# Patient Record
Sex: Female | Born: 1995 | Race: White | Hispanic: No | Marital: Single | Smoking: Never smoker
Health system: Southern US, Community
[De-identification: ages and names within clinical notes are randomized; demographics above are authoritative.]

## PROBLEM LIST (undated history)

## (undated) DIAGNOSIS — M722 Plantar fascial fibromatosis: Secondary | ICD-10-CM

## (undated) DIAGNOSIS — H8113 Benign paroxysmal vertigo, bilateral: Secondary | ICD-10-CM

## (undated) DIAGNOSIS — Z9889 Other specified postprocedural states: Secondary | ICD-10-CM

## (undated) DIAGNOSIS — R112 Nausea with vomiting, unspecified: Secondary | ICD-10-CM

## (undated) DIAGNOSIS — L309 Dermatitis, unspecified: Secondary | ICD-10-CM

## (undated) DIAGNOSIS — Z8709 Personal history of other diseases of the respiratory system: Secondary | ICD-10-CM

## (undated) DIAGNOSIS — M359 Systemic involvement of connective tissue, unspecified: Secondary | ICD-10-CM

## (undated) HISTORY — DX: Plantar fascial fibromatosis: M72.2

## (undated) HISTORY — PX: OTHER SURGICAL HISTORY: SHX169

## (undated) HISTORY — PX: FOOT SURGERY: SHX648

## (undated) HISTORY — DX: Dermatitis, unspecified: L30.9

## (undated) HISTORY — PX: IRRIGATION AND DEBRIDEMENT SEBACEOUS CYST: SHX5255

## (undated) HISTORY — PX: WISDOM TOOTH EXTRACTION: SHX21

## (undated) HISTORY — PX: SINOSCOPY: SHX187

## (undated) HISTORY — DX: Personal history of other diseases of the respiratory system: Z87.09

## (undated) HISTORY — PX: ELBOW SURGERY: SHX618

---

## 2016-01-18 NOTE — Telephone Encounter (Signed)
S: Pt. Calling about a worsened sore throat since stopping her nystatin rx.  B: Pt. Recently diagnosed with mono, has also been taking nystatin for thrush. At her visit on 12/21 she was instructed to stop the nystatin, and that it appeared the infection had resolved.  A: Pt. States since stopping the nystatin the sore throat has returned, and she is unsure if it is due to the thrush or the mono. She does see several white raised areas on the back of her tongue towards her throat.  R: RN paged on call physician Dr. Iran PlanasSoto.   Dr. Iran PlanasSoto advised ok to start Nystatin back up again (pt. Has one refill available). She advised if pt. Not feeling better by Tuesday to call office back for further instructions or possible re-evaluation.  Reason for Disposition  ??? [1] Pus on tonsils (back of throat) AND [2]  fever AND [3] swollen neck lymph nodes ("glands")    Protocols used: SORE THROAT-ADULT-AH

## 2017-02-15 ENCOUNTER — Ambulatory Visit: Payer: BC Managed Care – PPO | Admitting: Obstetrics & Gynecology

## 2017-02-15 ENCOUNTER — Encounter: Payer: Self-pay | Admitting: Obstetrics & Gynecology

## 2017-02-15 ENCOUNTER — Ambulatory Visit (INDEPENDENT_AMBULATORY_CARE_PROVIDER_SITE_OTHER): Payer: BC Managed Care – PPO

## 2017-02-15 VITALS — BP 114/76 | Ht 62.0 in | Wt 118.0 lb

## 2017-02-15 DIAGNOSIS — T8332XA Displacement of intrauterine contraceptive device, initial encounter: Secondary | ICD-10-CM

## 2017-02-15 DIAGNOSIS — Z30431 Encounter for routine checking of intrauterine contraceptive device: Secondary | ICD-10-CM

## 2017-02-15 DIAGNOSIS — Z01419 Encounter for gynecological examination (general) (routine) without abnormal findings: Secondary | ICD-10-CM

## 2017-02-15 DIAGNOSIS — Z113 Encounter for screening for infections with a predominantly sexual mode of transmission: Secondary | ICD-10-CM | POA: Diagnosis not present

## 2017-02-15 DIAGNOSIS — N898 Other specified noninflammatory disorders of vagina: Secondary | ICD-10-CM | POA: Diagnosis not present

## 2017-02-15 LAB — WET PREP FOR TRICH, YEAST, CLUE

## 2017-02-15 MED ORDER — FLUCONAZOLE 150 MG PO TABS: 150.0000 mg | ORAL_TABLET | Freq: Every day | ORAL | 1 refills | Status: AC

## 2017-02-15 MED ORDER — TINIDAZOLE 500 MG PO TABS: 2.0000 g | ORAL_TABLET | Freq: Every day | ORAL | 0 refills | Status: AC

## 2017-02-15 NOTE — Patient Instructions (Signed)
1. Encounter for routine gynecological examination with Papanicolaou smear of cervix Normal gynecologic exam and normal pelvic ultrasound.  Pap reflex done today.  Breast exam normal.  Will establish with family physician.  2. Encounter for routine checking of intrauterine contraceptive device (IUD) Well on Mirena IUD since November 2017.  Strings not visible on exam.  3. Intrauterine contraceptive device threads lost, initial encounter Confirmed good intra-uterine position of the Mirena IUD by ultrasound today.  Patient reassured. - US Transvaginal Non-OB  4. Routine screening for STI (sexually transmitted infection) Gonorrhea and Chlamydia done on the Pap test.  Declines rest of STI screen.  5. Vaginal itching Wet prep revealed both a bacterial vaginosis and a yeast infection.  Will treat the bacterial vaginosis first with tinidazole tablets.  Usage reviewed.  Will then start fluconazole to treat the yeast vaginitis.  Can use probiotic afterwards to prevent recurrence. - WET PREP FOR [place], YEAST, CLUE  Other orders - tinidazole (TINDAMAX) 500 MG tablet; Take 4 tablets (2,000 mg total) by mouth daily for 2 days. - fluconazole (DIFLUCAN) 150 MG tablet; Take 1 tablet (150 mg total) by mouth daily for 3 days.  Roberta Gillespie, it was a pleasure meeting you today!  I will inform you of your results as soon as they are available.  Health Maintenance, Female Adopting a healthy lifestyle and getting preventive care can go a long way to promote health and wellness. Talk with your health care provider about what schedule of regular examinations is right for you. This is a good chance for you to check in with your provider about disease prevention and staying healthy. In between checkups, there are plenty of things you can do on your own. Experts have done a lot of research about which lifestyle changes and preventive measures are most likely to keep you healthy. Ask your health care provider for more  information. Weight and diet Eat a healthy diet  Be sure to include plenty of vegetables, fruits, low-fat dairy products, and lean protein.  Do not eat a lot of foods high in solid fats, added sugars, or salt.  Get regular exercise. This is one of the most important things you can do for your health. ? Most adults should exercise for at least 150 minutes each week. The exercise should increase your heart rate and make you sweat (moderate-intensity exercise). ? Most adults should also do strengthening exercises at least twice a week. This is in addition to the moderate-intensity exercise.  Maintain a healthy weight  Body mass index (BMI) is a measurement that can be used to identify possible weight problems. It estimates body fat based on height and weight. Your health care provider can help determine your BMI and help you achieve or maintain a healthy weight.  For females 8 years of age and older: ? A BMI below 18.5 is considered underweight. ? A BMI of 18.5 to 24.9 is normal. ? A BMI of 25 to 29.9 is considered overweight. ? A BMI of 30 and above is considered obese.  Watch levels of cholesterol and blood lipids  You should start having your blood tested for lipids and cholesterol at 24 years of age, then have this test every 5 years.  You may need to have your cholesterol levels checked more often if: ? Your lipid or cholesterol levels are high. ? You are older than 24 years of age. ? You are at high risk for heart disease.  Cancer screening Lung Cancer  Lung cancer screening  is recommended for adults 76-6 years old who are at high risk for lung cancer because of a history of smoking.  A yearly low-dose CT scan of the lungs is recommended for people who: ? Currently smoke. ? Have quit within the past 15 years. ? Have at least a 30-pack-year history of smoking. A pack year is smoking an average of one pack of cigarettes a day for 1 year.  Yearly screening should continue  until it has been 15 years since you quit.  Yearly screening should stop if you develop a health problem that would prevent you from having lung cancer treatment.  Breast Cancer  Practice breast self-awareness. This means understanding how your breasts normally appear and feel.  It also means doing regular breast self-exams. Let your health care provider know about any changes, no matter how small.  If you are in your 20s or 30s, you should have a clinical breast exam (CBE) by a health care provider every 1-3 years as part of a regular health exam.  If you are 90 or older, have a CBE every year. Also consider having a breast X-ray (mammogram) every year.  If you have a family history of breast cancer, talk to your health care provider about genetic screening.  If you are at high risk for breast cancer, talk to your health care provider about having an MRI and a mammogram every year.  Breast cancer gene (BRCA) assessment is recommended for women who have family members with BRCA-related cancers. BRCA-related cancers include: ? Breast. ? Ovarian. ? Tubal. ? Peritoneal cancers.  Results of the assessment will determine the need for genetic counseling and BRCA1 and BRCA2 testing.  Cervical Cancer Your health care provider may recommend that you be screened regularly for cancer of the pelvic organs (ovaries, uterus, and vagina). This screening involves a pelvic examination, including checking for microscopic changes to the surface of your cervix (Pap test). You may be encouraged to have this screening done every 3 years, beginning at age 71.  For women ages 44-65, health care providers may recommend pelvic exams and Pap testing every 3 years, or they may recommend the Pap and pelvic exam, combined with testing for human papilloma virus (HPV), every 5 years. Some types of HPV increase your risk of cervical cancer. Testing for HPV may also be done on women of any age with unclear Pap test  results.  Other health care providers may not recommend any screening for nonpregnant women who are considered low risk for pelvic cancer and who do not have symptoms. Ask your health care provider if a screening pelvic exam is right for you.  If you have had past treatment for cervical cancer or a condition that could lead to cancer, you need Pap tests and screening for cancer for at least 20 years after your treatment. If Pap tests have been discontinued, your risk factors (such as having a new sexual partner) need to be reassessed to determine if screening should resume. Some women have medical problems that increase the chance of getting cervical cancer. In these cases, your health care provider may recommend more frequent screening and Pap tests.  Colorectal Cancer  This type of cancer can be detected and often prevented.  Routine colorectal cancer screening usually begins at 24 years of age and continues through 24 years of age.  Your health care provider may recommend screening at an earlier age if you have risk factors for colon cancer.  Your health care provider  may also recommend using home test kits to check for hidden blood in the stool.  A small camera at the end of a tube can be used to examine your colon directly (sigmoidoscopy or colonoscopy). This is done to check for the earliest forms of colorectal cancer.  Routine screening usually begins at age 32.  Direct examination of the colon should be repeated every 5-10 years through 24 years of age. However, you may need to be screened more often if early forms of precancerous polyps or small growths are found.  Skin Cancer  Check your skin from head to toe regularly.  Tell your health care provider about any new moles or changes in moles, especially if there is a change in a mole's shape or color.  Also tell your health care provider if you have a mole that is larger than the size of a pencil eraser.  Always use sunscreen.  Apply sunscreen liberally and repeatedly throughout the day.  Protect yourself by wearing long sleeves, pants, a wide-brimmed hat, and sunglasses whenever you are outside.  Heart disease, diabetes, and high blood pressure  High blood pressure causes heart disease and increases the risk of stroke. High blood pressure is more likely to develop in: ? People who have blood pressure in the high end of the normal range (130-139/85-89 mm Hg). ? People who are overweight or obese. ? People who are African American.  If you are 69-58 years of age, have your blood pressure checked every 3-5 years. If you are 52 years of age or older, have your blood pressure checked every year. You should have your blood pressure measured twice-once when you are at a hospital or clinic, and once when you are not at a hospital or clinic. Record the average of the two measurements. To check your blood pressure when you are not at a hospital or clinic, you can use: ? An automated blood pressure machine at a pharmacy. ? A home blood pressure monitor.  If you are between 73 years and 16 years old, ask your health care provider if you should take aspirin to prevent strokes.  Have regular diabetes screenings. This involves taking a blood sample to check your fasting blood sugar level. ? If you are at a normal weight and have a low risk for diabetes, have this test once every three years after 24 years of age. ? If you are overweight and have a high risk for diabetes, consider being tested at a younger age or more often. Preventing infection Hepatitis B  If you have a higher risk for hepatitis B, you should be screened for this virus. You are considered at high risk for hepatitis B if: ? You were born in a country where hepatitis B is common. Ask your health care provider which countries are considered high risk. ? Your parents were born in a high-risk country, and you have not been immunized against hepatitis B (hepatitis B  vaccine). ? You have HIV or AIDS. ? You use needles to inject street drugs. ? You live with someone who has hepatitis B. ? You have had sex with someone who has hepatitis B. ? You get hemodialysis treatment. ? You take certain medicines for conditions, including cancer, organ transplantation, and autoimmune conditions.  Hepatitis C  Blood testing is recommended for: ? Everyone born from [birth year] through 1967. ? Anyone with known risk factors for hepatitis C.  Sexually transmitted infections (STIs)  You should be screened for sexually transmitted infections (  STIs) including gonorrhea and chlamydia if: ? You are sexually active and are younger than 24 years of age. ? You are older than 24 years of age and your health care provider tells you that you are at risk for this type of infection. ? Your sexual activity has changed since you were last screened and you are at an increased risk for chlamydia or gonorrhea. Ask your health care provider if you are at risk.  If you do not have HIV, but are at risk, it may be recommended that you take a prescription medicine daily to prevent HIV infection. This is called pre-exposure prophylaxis (PrEP). You are considered at risk if: ? You are sexually active and do not regularly use condoms or know the HIV status of your partner(s). ? You take drugs by injection. ? You are sexually active with a partner who has HIV.  Talk with your health care provider about whether you are at high risk of being infected with HIV. If you choose to begin PrEP, you should first be tested for HIV. You should then be tested every 3 months for as long as you are taking PrEP. Pregnancy  If you are premenopausal and you may become pregnant, ask your health care provider about preconception counseling.  If you may become pregnant, take 400 to 800 micrograms (mcg) of folic acid every day.  If you want to prevent pregnancy, talk to your health care provider about birth control  (contraception). Osteoporosis and menopause  Osteoporosis is a disease in which the bones lose minerals and strength with aging. This can result in serious bone fractures. Your risk for osteoporosis can be identified using a bone density scan.  If you are 37 years of age or older, or if you are at risk for osteoporosis and fractures, ask your health care provider if you should be screened.  Ask your health care provider whether you should take a calcium or vitamin D supplement to lower your risk for osteoporosis.  Menopause may have certain physical symptoms and risks.  Hormone replacement therapy may reduce some of these symptoms and risks. Talk to your health care provider about whether hormone replacement therapy is right for you. Follow these instructions at home:  Schedule regular health, dental, and eye exams.  Stay current with your immunizations.  Do not use any tobacco products including cigarettes, chewing tobacco, or electronic cigarettes.  If you are pregnant, do not drink alcohol.  If you are breastfeeding, limit how much and how often you drink alcohol.  Limit alcohol intake to no more than 1 drink per day for nonpregnant women. One drink equals 12 ounces of beer, 5 ounces of wine, or 1 ounces of hard liquor.  Do not use street drugs.  Do not share needles.  Ask your health care provider for help if you need support or information about quitting drugs.  Tell your health care provider if you often feel depressed.  Tell your health care provider if you have ever been abused or do not feel safe at home. This information is not intended to replace advice given to you by your health care provider. Make sure you discuss any questions you have with your health care provider. Document Released: 07/28/2010 Document Revised: 06/20/2015 Document Reviewed: 10/16/2014 Elsevier Interactive Patient Education  [employer].

## 2017-02-15 NOTE — Progress Notes (Signed)
Roberta Gillespie December 24, 1995 [id number]   History:    22 y.o. G0 Stable boyfriend.  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  Many courses of antibiotics.  C/O vaginal itching.  Well on Mirena IUD x 11/2015.  No menses on it.  No pelvic pain.  No pain with intercourse.  Breasts wnl.  Urine/BMs wnl.  Will establish with a Fam MD. Patient has Raynaud's syndrome.  Was followed in the past by a Rheumatologist.  Tested for Lupus, results came back negative.  Past medical history,surgic[al history, family history and social history were all reviewed and documented in the EPIC chart.  Gynecologic History No LMP recorded. Patient is not currently having periods (Reason: IUD). Contraception: Mirena IUD x 11/2015 Last Pap: normal Last mammogram: Never  Obstetric History OB History  Gravida Para Term Preterm AB Living  0 0 0 0 0 0  SAB TAB Ectopic Multiple Live Births  0 0 0 0 0         ROS: A ROS was performed and pertinent positives and negatives are included in the history.  GENERAL: No fevers or chills. HEENT: No change in vision, no earache, sore throat or sinus congestion. NECK: No pain or stiffness. CARDIOVASCULAR: No chest pain or pressure. No palpitations. PULMONARY: No shortness of breath, cough or wheeze. GASTROINTESTINAL: No abdominal pain, nausea, vomiting or diarrhea, melena or bright red blood per rectum. GENITOURINARY: No urinary frequency, urgency, hesitancy or dysuria. MUSCULOSKELETAL: No joint or muscle pain, no back pain, no recent trauma. DERMATOLOGIC: No rash, no itching, no lesions. ENDOCRINE: No polyuria, polydipsia, no heat or cold intolerance. No recent change in weight. HEMATOLOGICAL: No anemia or easy bruising or bleeding. NEUROLOGIC: No headache, seizures, numbness, tingling or weakness. PSYCHIATRIC: No depression, no loss of interest in normal activity or change in sleep  pattern.     Exam:   BP 114/76   Ht 5\' 2"  (1.575 m)   Wt 118 lb (53.5 kg)   BMI 21.58 kg/m   Body mass index is 21.58 kg/m.  General appearance : Well developed well nourished female. No acute distress HEENT: Eyes: no retinal hemorrhage or exudates,  Neck supple, trachea midline, no carotid bruits, no thyroidmegaly Lungs: Clear to auscultation, no rhonchi or wheezes, or rib retractions  Heart: Regular rate and rhythm, no murmurs or gallops Breast:Examined in sitting and supine position were symmetrical in appearance, no palpable masses or tenderness,  no skin retraction, no nipple inversion, no nipple discharge, no skin discoloration, no axillary or supraclavicular lymphadenopathy Abdomen: no palpable masses or tenderness, no rebound or guarding Extremities: no edema or skin discoloration or tenderness  Pelvic: Vulva normal  Bartholin, Urethra, Skene Glands: Within normal limits             Vagina: No gross lesions or discharge  Cervix: No gross lesions or discharge.  IUD strings not seen. Pap reflex, Gono-Chlam done.    Uterus  AV, normal size, shape and consistency, non-tender and mobile  Adnexa  Without masses or tenderness  Anus and perineum  normal   Pelvic US today: T/V images.  Uterus anteverted, homogeneous measuring 7.48 x 3.85 x 2.98 cm.  Endometrial lining normal at 5.7 mm.  IUD in normal intrauterine position.  Right and left ovary normal.  Dominant follicle on the right ovary measuring 2.3 x 1.8 cm.  No apparent mass in  the right and left adnexa.  No free fluid in the posterior cul-de-sac.   Assessment/Plan:  22 y.o. female for annual exam   1. Encounter for routine gynecological examination with Papanicolaou smear of cervix Normal gynecologic exam and normal pelvic ultrasound.  Pap reflex done today.  Breast exam normal.  Will establish with family physician.  2. Encounter for routine checking of intrauterine contraceptive device (IUD) Well on Mirena IUD since  November 2017.  Strings not visible on exam.  3. Intrauterine contraceptive device threads lost, initial encounter Confirmed good intra-uterine position of the Mirena IUD by ultrasound today.  Patient reassured. - US Transvaginal Non-OB  4. Routine screening for STI (sexually transmitted infection) Gonorrhea and Chlamydia done on the Pap test.  Declines rest of STI screen.  5. Vaginal itching Wet prep revealed both a bacterial vaginosis and a yeast infection.  Will treat the bacterial vaginosis first with tinidazole tablets.  Usage reviewed.  Will then start fluconazole to treat the yeast vaginitis.  Can use probiotic afterwards to prevent recurrence. - WET PREP FOR [place], YEAST, CLUE  Other orders - tinidazole (TINDAMAX) 500 MG tablet; Take 4 tablets (2,000 mg total) by mouth daily for 2 days. - fluconazole (DIFLUCAN) 150 MG tablet; Take 1 tablet (150 mg total) by mouth daily for 3 days.  Counseling on above issues more than 50% for 20 minutes.  [name] MD, 11:25 AM 02/15/2017

## 2017-02-18 LAB — PAP IG, CT-NG, RFX HPV ASCU
C. TRACHOMATIS RNA, TMA: NOT DETECTED
N. GONORRHOEAE RNA, TMA: NOT DETECTED

## 2017-04-09 ENCOUNTER — Telehealth: Payer: Self-pay

## 2017-04-09 NOTE — Telephone Encounter (Signed)
Patient called stating she has Mirena IUD and has been having some bleeding more than usual and some cramping. She said last time it was checked she was told the strings were "in the cervix".  She questioned was that okay?    Note, she was just in 02/15/17 and it was noted in visit notes "Well on Mirena IUD x 11/2015.  No menses on it.  No pelvic pain.  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 I will recommend office visit since this is a new problem.

## 2017-04-09 NOTE — Telephone Encounter (Signed)
Patient called back in voice mail not realizing I had left her a voicemail. By the time I called her back she heard my message and has appt with [place] for Monday.

## 2017-04-09 NOTE — Telephone Encounter (Signed)
Called patient and got her voice mail. I left message that since this is new problem that Dr. Seymour BarsLavoie will want to have visit with her to assess.  I recommended she call the office and schedule visit for next week .

## 2017-04-12 ENCOUNTER — Ambulatory Visit: Payer: BC Managed Care – PPO | Admitting: Women's Health

## 2017-04-12 ENCOUNTER — Encounter: Payer: Self-pay | Admitting: Women's Health

## 2017-04-12 VITALS — BP 110/78

## 2017-04-12 DIAGNOSIS — N898 Other specified noninflammatory disorders of vagina: Secondary | ICD-10-CM

## 2017-04-12 DIAGNOSIS — R1030 Lower abdominal pain, unspecified: Secondary | ICD-10-CM

## 2017-04-12 DIAGNOSIS — R5383 Other fatigue: Secondary | ICD-10-CM

## 2017-04-12 DIAGNOSIS — Z113 Encounter for screening for infections with a predominantly sexual mode of transmission: Secondary | ICD-10-CM | POA: Diagnosis not present

## 2017-04-12 DIAGNOSIS — N912 Amenorrhea, unspecified: Secondary | ICD-10-CM | POA: Diagnosis not present

## 2017-04-12 LAB — PREGNANCY, URINE: PREG TEST UR: NEGATIVE

## 2017-04-12 LAB — WET PREP FOR TRICH, YEAST, CLUE

## 2017-04-12 NOTE — Progress Notes (Signed)
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  Low mid abdominal cramping, spotting, had been amenorrheic, occasional nausea with some dizziness and breast tenderness,  symptoms intermittent, not  daily.  Denies vaginal itching, odor, constipation, or fever. Dizziness most often in the morning, none today, again not daily. 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  Was treated for both BV and yeast January 2019, symptoms resolved..Same partner for 9 months, no STD screen done.  02/2017 ultrasound confirmed placement. Mirena IUD, strings not visible, placed 11/2015 ([facility]   Exam: Appears well. Blood pressure 110/78, color good. Abdomen soft without round or radiation. External genitalia within normal limits, speculum exam no visible blood, IUD strings not visible, wet prep negative, GC/Chlamydia culture taken. Bimanual no CMT or adnexal tenderness, tenderness higher than left ovary site. UPT neg  mirena IUD with spotting STD screen Vertigo  Plan: Reassurance given regarding normal exam, occasional spotting normal with mirena IUD.  Reviewed breast tenderness that is cyclic is more hormonal related,  Normal, continue SBE's and report changes. GC/Chlamydia culture, HIV, hep B, C, RRP and TSH. Reviewed importance of increasing calories at breakfast, continue drinking plenty of fluids which she is doing. Instructed to call if symptoms persist.

## 2017-04-12 NOTE — Addendum Note (Signed)
Addended by: Tito DineBONHAM, KIM A on: 04/12/2017 03:20 PM   Modules accepted: Orders

## 2017-04-13 LAB — TSH: TSH: 2.05 mIU/L

## 2017-04-13 LAB — C. TRACHOMATIS/N. GONORRHOEAE RNA
C. trachomatis RNA, TMA: NOT DETECTED
N. gonorrhoeae RNA, TMA: NOT DETECTED

## 2017-04-13 LAB — RPR: RPR Ser Ql: NONREACTIVE

## 2017-04-13 LAB — HEPATITIS C ANTIBODY
Hepatitis C Ab: NONREACTIVE
SIGNAL TO CUT-OFF: 0.01 (ref <1.00)

## 2017-04-13 LAB — HIV ANTIBODY (ROUTINE TESTING W REFLEX): HIV 1&2 Ab, 4th Generation: NONREACTIVE

## 2017-04-13 LAB — HEPATITIS B SURFACE ANTIGEN: Hepatitis B Surface Ag: NONREACTIVE

## 2017-06-08 ENCOUNTER — Encounter: Payer: Self-pay | Admitting: Women's Health

## 2017-06-08 ENCOUNTER — Encounter: Payer: Self-pay | Admitting: Obstetrics & Gynecology

## 2017-06-28 ENCOUNTER — Telehealth: Payer: Self-pay | Admitting: *Deleted

## 2017-06-28 ENCOUNTER — Other Ambulatory Visit: Payer: Self-pay

## 2017-06-28 MED ORDER — FLUCONAZOLE 150 MG PO TABS: ORAL_TABLET | ORAL | 0 refills | Status: DC

## 2017-06-28 NOTE — Telephone Encounter (Signed)
Yes, agree with Fluconazole treatment.

## 2017-06-28 NOTE — Telephone Encounter (Signed)
Spoke with patient and let her know okay for Rx per Dr. Mackey BirchwoodML. Rx sent.

## 2017-06-28 NOTE — Telephone Encounter (Signed)
Patient called c/o vaginal itching only asked if Rx for diflucan can be sent to pharmacy. 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 Please advise

## 2017-08-26 ENCOUNTER — Ambulatory Visit
Admission: RE | Admit: 2017-08-26 | Discharge: 2017-08-26 | Disposition: A | Discharge status: Home / Self Care | Payer: BC Managed Care – PPO | Source: Ambulatory Visit | Attending: Allergy | Admitting: Allergy

## 2017-08-26 ENCOUNTER — Other Ambulatory Visit: Payer: Self-pay | Admitting: Allergy

## 2017-08-26 DIAGNOSIS — J4530 Mild persistent asthma, uncomplicated: Secondary | ICD-10-CM

## 2017-09-16 ENCOUNTER — Encounter: Payer: Self-pay | Admitting: Obstetrics & Gynecology

## 2017-09-16 ENCOUNTER — Ambulatory Visit: Payer: BC Managed Care – PPO | Admitting: Obstetrics & Gynecology

## 2017-09-16 VITALS — BP 116/78

## 2017-09-16 DIAGNOSIS — N898 Other specified noninflammatory disorders of vagina: Secondary | ICD-10-CM | POA: Diagnosis not present

## 2017-09-16 DIAGNOSIS — R300 Dysuria: Secondary | ICD-10-CM | POA: Diagnosis not present

## 2017-09-16 LAB — WET PREP FOR TRICH, YEAST, CLUE

## 2017-09-16 MED ORDER — FLUCONAZOLE 150 MG PO TABS: 150.0000 mg | ORAL_TABLET | Freq: Every day | ORAL | 2 refills | Status: AC

## 2017-09-16 NOTE — Progress Notes (Signed)
    Roberta StallsRachel Gillespie 03/22/1995 [id number]        22 y.o.  G0 Stable boyfriend.  Spanish teacher in HS.  RP: Vaginal itching x a few days  HPI:  Vaginal itching x a few days.  No vaginal odor.  Mild burning with micturition but better with cranberry juice.  No urinary frequency.  No pelvic pain.  No fever.  Well on the Mirena IUD since November 2017.  Having very light periods every month.  Declines STI screen.  Many environmental allergies under treatment with allergist and improving.   OB History  Gravida Para Term Preterm AB Living  0 0 0 0 0 0  SAB TAB Ectopic Multiple Live Births  0 0 0 0 0    Past medical history,surgical history, problem list, medications, allergies, family history and social history were all reviewed and documented in the EPIC chart.   Directed ROS with pertinent positives and negatives documented in the history of present illness/assessment and plan.  Exam:  Vitals:   09/16/17 1539  BP: 116/78   General appearance:  Normal  Gynecologic exam: Vulva normal.  Speculum: Cervix and vagina normal.  Mild increase in vaginal discharge.  Wet prep done.  Wet prep: Yeasts present  U/A: Completely negative   Assessment/Plan:  22 y.o. G0  1. Vaginal pruritus Yeast vaginitis confirmed by wet prep.  Will treat with fluconazole 150 mg/tab. 1 tablet daily for 3 days.  Patient will then use probiotic tablet or suppository vaginally daily for a week.  Repeat fluconazole 1 tablet weekly for 3 weeks.  And continue with probiotic vaginally once a week, long-term.  Usage of fluconazole reviewed with patient and prescription sent to pharmacy. - WET PREP FOR TRICH, YEAST, CLUE  2. Dysuria Urine analysis completely negative.  Patient reassured. - Urinalysis,Complete w/RFL Culture  Other orders - fluconazole (DIFLUCAN) 150 MG tablet; Take 1 tablet (150 mg total) by mouth daily for 3 days.  Counseling on above issues and coordination of care more than 50% for 15  minutes.  Genia DelMarie-Lyne Milee Qualls MD, 3:45 PM 09/16/2017

## 2017-09-16 NOTE — Patient Instructions (Addendum)
1. Vaginal pruritus Yeast vaginitis confirmed by wet prep.  Will treat with fluconazole 150 mg/tab. 1 tablet daily for 3 days.  Patient will then use probiotic tablet or suppository vaginally daily for a week.  Repeat fluconazole 1 tablet weekly for 3 weeks.  And continue with probiotic vaginally once a week, long-term.  Usage of fluconazole reviewed with patient and prescription sent to pharmacy. - WET PREP FOR TRICH, YEAST, CLUE  2. Dysuria Urine analysis completely negative.  Patient reassured. - Urinalysis,Complete w/RFL Culture  Other orders - fluconazole (DIFLUCAN) 150 MG tablet; Take 1 tablet (150 mg total) by mouth daily for 3 days.  Fleet Contrasachel, good seeing you today!

## 2017-09-17 LAB — URINALYSIS, COMPLETE W/RFL CULTURE
BACTERIA UA: NONE SEEN /HPF
Bilirubin Urine: NEGATIVE
Glucose, UA: NEGATIVE
HYALINE CAST: NONE SEEN /LPF
Hgb urine dipstick: NEGATIVE
KETONES UR: NEGATIVE
Leukocyte Esterase: NEGATIVE
Nitrites, Initial: NEGATIVE
Protein, ur: NEGATIVE
RBC / HPF: NONE SEEN /HPF (ref 0–2)
SPECIFIC GRAVITY, URINE: 1.01 (ref 1.001–1.03)
WBC, UA: NONE SEEN /HPF (ref 0–5)
pH: 6.5 (ref 5.0–8.0)

## 2017-09-17 LAB — NO CULTURE INDICATED

## 2018-02-14 ENCOUNTER — Ambulatory Visit (INDEPENDENT_AMBULATORY_CARE_PROVIDER_SITE_OTHER): Payer: BC Managed Care – PPO | Admitting: Otolaryngology

## 2018-03-06 ENCOUNTER — Other Ambulatory Visit: Payer: Self-pay

## 2018-03-06 ENCOUNTER — Encounter (HOSPITAL_COMMUNITY): Payer: Self-pay

## 2018-03-06 DIAGNOSIS — R0602 Shortness of breath: Secondary | ICD-10-CM | POA: Insufficient documentation

## 2018-03-06 DIAGNOSIS — Z5321 Procedure and treatment not carried out due to patient leaving prior to being seen by health care provider: Secondary | ICD-10-CM | POA: Diagnosis not present

## 2018-03-06 NOTE — ED Triage Notes (Signed)
States taking benzonatate 200 mg for cough and now feels like throat is closing up and hard to breathe and numb for the last 30 minutes PTA no drooling or hoarseness to voice noted. Pt appears anxious.

## 2018-03-07 ENCOUNTER — Emergency Department (HOSPITAL_COMMUNITY)
Admission: EM | Admit: 2018-03-07 | Discharge: 2018-03-07 | Disposition: A | Discharge status: Home / Self Care | Payer: BC Managed Care – PPO | Attending: Emergency Medicine | Admitting: Emergency Medicine

## 2018-03-07 HISTORY — DX: Systemic involvement of connective tissue, unspecified: M35.9

## 2018-03-07 NOTE — ED Notes (Signed)
Pt called twice and no response. Prior to being called this RN spoke with pt. She stated that she no loner felt like she needed to be seen and this RN told pt that we couldn't advise her to leave without being seen.

## 2018-03-16 ENCOUNTER — Other Ambulatory Visit: Payer: Self-pay | Admitting: Otolaryngology

## 2018-03-16 DIAGNOSIS — J329 Chronic sinusitis, unspecified: Secondary | ICD-10-CM

## 2018-03-25 ENCOUNTER — Other Ambulatory Visit: Payer: BC Managed Care – PPO

## 2018-03-25 ENCOUNTER — Ambulatory Visit
Admission: RE | Admit: 2018-03-25 | Discharge: 2018-03-25 | Disposition: A | Discharge status: Home / Self Care | Payer: BC Managed Care – PPO | Source: Ambulatory Visit | Attending: Otolaryngology | Admitting: Otolaryngology

## 2018-03-25 DIAGNOSIS — J329 Chronic sinusitis, unspecified: Secondary | ICD-10-CM

## 2018-05-09 ENCOUNTER — Encounter: Payer: BC Managed Care – PPO | Admitting: Obstetrics & Gynecology

## 2018-06-03 ENCOUNTER — Ambulatory Visit
Admission: RE | Admit: 2018-06-03 | Discharge: 2018-06-03 | Disposition: A | Discharge status: Home / Self Care | Payer: BC Managed Care – PPO | Source: Ambulatory Visit | Attending: Family Medicine | Admitting: Family Medicine

## 2018-06-03 ENCOUNTER — Other Ambulatory Visit: Payer: Self-pay | Admitting: Family Medicine

## 2018-06-03 DIAGNOSIS — R0602 Shortness of breath: Secondary | ICD-10-CM

## 2018-07-07 ENCOUNTER — Other Ambulatory Visit: Payer: Self-pay | Admitting: Otolaryngology

## 2018-07-19 ENCOUNTER — Other Ambulatory Visit: Payer: Self-pay

## 2018-07-19 ENCOUNTER — Ambulatory Visit: Payer: BC Managed Care – PPO | Admitting: Obstetrics & Gynecology

## 2018-07-19 ENCOUNTER — Encounter: Payer: Self-pay | Admitting: Obstetrics & Gynecology

## 2018-07-19 VITALS — BP 118/78 | Ht 62.0 in | Wt 122.0 lb

## 2018-07-19 DIAGNOSIS — T8332XA Displacement of intrauterine contraceptive device, initial encounter: Secondary | ICD-10-CM

## 2018-07-19 DIAGNOSIS — R102 Pelvic and perineal pain: Secondary | ICD-10-CM

## 2018-07-19 DIAGNOSIS — Z30431 Encounter for routine checking of intrauterine contraceptive device: Secondary | ICD-10-CM | POA: Diagnosis not present

## 2018-07-19 DIAGNOSIS — Z01419 Encounter for gynecological examination (general) (routine) without abnormal findings: Secondary | ICD-10-CM | POA: Diagnosis not present

## 2018-07-19 DIAGNOSIS — N898 Other specified noninflammatory disorders of vagina: Secondary | ICD-10-CM

## 2018-07-19 DIAGNOSIS — N3000 Acute cystitis without hematuria: Secondary | ICD-10-CM

## 2018-07-19 NOTE — Progress Notes (Signed)
Roberta Gillespie 06/12/1995 Shearon [id number]   History:    23 y.o. G0  RP:  Established patient presenting for annual gyn exam   HPI: Well on Mirena IUD since November 2017.  No breakthrough bleeding.  No abnormal vaginal discharge, but complains of vaginal itching.  Midline pelvic discomfort in suprapubic area.  Mild discomfort when passing urine but no frequency and no blood in urine.  No fever.  Bowel movements normal.  Breast normal.  Body mass index 22.31.  Good fitness and nutrition.  Past medical history,surgical history, family history and social history were all reviewed and documented in the EPIC chart.  Gynecologic History Patient's last menstrual period was 07/12/2018. Contraception: Mirena IUD x 11/2015 Last Pap: 01/2017. Results were: Negative Last mammogram: Never Bone Density: Never Colonoscopy: Never  Obstetric History OB History  Gravida Para Term Preterm AB Living  0 0 0 0 0 0  SAB TAB Ectopic Multiple Live Births  0 0 0 0 0     ROS: A ROS was performed and pertinent positives and negatives are included in the history.  GENERAL: No fevers or chills. HEENT: No change in vision, no earache, sore throat or sinus congestion. NECK: No pain or stiffness. CARDIOVASCULAR: No chest pain or pressure. No palpitations. PULMONARY: No shortness of breath, cough or wheeze. GASTROINTESTINAL: No abdominal pain, nausea, vomiting or diarrhea, melena or bright red blood per rectum. GENITOURINARY: No urinary frequency, urgency, hesitancy or dysuria. MUSCULOSKELETAL: No joint or muscle pain, no back pain, no recent trauma. DERMATOLOGIC: No rash, no itching, no lesions. ENDOCRINE: No polyuria, polydipsia, no heat or cold intolerance. No recent change in weight. HEMATOLOGICAL: No anemia or easy bruising or bleeding. NEUROLOGIC: No headache, seizures, numbness, tingling or weakness. PSYCHIATRIC: No depression, no loss of interest in normal activity or change in sleep pattern.     Exam:   BP  118/78 (BP Location: Right Arm, Patient Position: Sitting, Cuff Size: Normal)   Ht 5\' 2"  (1.575 m)   Wt 122 lb (55.3 kg)   LMP 07/12/2018 Comment: spotting  BMI 22.31 kg/m   Body mass index is 22.31 kg/m.  General appearance : Well developed well nourished female. No acute distress HEENT: Eyes: no retinal hemorrhage or exudates,  Neck supple, trachea midline, no carotid bruits, no thyroidmegaly Lungs: Clear to auscultation, no rhonchi or wheezes, or rib retractions  Heart: Regular rate and rhythm, no murmurs or gallops Breast:Examined in sitting and supine position were symmetrical in appearance, no palpable masses or tenderness,  no skin retraction, no nipple inversion, no nipple discharge, no skin discoloration, no axillary or supraclavicular lymphadenopathy Abdomen: no palpable masses or tenderness, no rebound or guarding Extremities: no edema or skin discoloration or tenderness  Pelvic: Vulva: Normal             Vagina: No gross lesions or discharge.  Wet prep done.  Cervix: No gross lesions or discharge.  IUD strings not visible.  Pap reflex done.    Uterus  AV, normal size, shape and consistency, non-tender and mobile  Adnexa  Without masses or tenderness  Anus: Normal  Wet prep negative  U/A: Yellow cloudy, Protein negative, Nitrites negative, WBC 40-60, RBC 20-40, Moderate Bacteria.  Pending Urine Culture.   Assessment/Plan:  23 y.o. female for annual exam   1. Encounter for routine gynecological examination with Papanicolaou smear of cervix Normal gynecologic exam.  Pap reflex done.  Breast exam normal.  Good body mass index at 22.31.  Continue with fitness  and healthy nutrition.  2. Encounter for routine checking of intrauterine contraceptive device (IUD) Well on Mirena IUD since November 2017.  IUD strings not visualized on exam.  Will come back for pelvic ultrasound to locate the Mirena IUD.  3. Intrauterine contraceptive device threads lost, initial encounter  Follow-up with pelvic ultrasound to locate the Mirena IUD. - US Transvaginal Non-OB; Future  4. Pelvic pain in female Probable cystitis per urine analysis.  Urine culture pending.  Will treat with Cipro 500 mg BID x 5 days.  Usage and risks reviewed.  Prescription sent to pharmacy.  Fluconazole as needed after the ABTx.  Prescription sent to pharmacy. - US Transvaginal Non-OB; Future - U/A, Rfx Urine Culture  5. Vaginal pruritus No evidence of yeast vaginitis on wet prep.  Will try Hydrocortisone !% cream. - WET PREP FOR TRICH, YEAST, CLUE  Other orders - Probiotic Product (PROBIOTIC PO); Take 1 tablet by mouth daily. - cetirizine (ZYRTEC) 10 MG tablet; Take 10 mg by mouth daily. - levonorgestrel (MIRENA) 20 MCG/24HR IUD; 1 each by Intrauterine route once. - ciprofloxacin (CIPRO) 500 MG tablet; Take 1 tablet (500 mg total) by mouth 2 (two) times daily for 5 days. - fluconazole (DIFLUCAN) 150 MG tablet; Take 1 tablet (150 mg total) by mouth daily for 3 days.  Counseling on above issues and coordination of care more than 50% for 10 minutes.  [name] MD, 4:33 PM 07/19/2018

## 2018-07-20 ENCOUNTER — Other Ambulatory Visit: Payer: BC Managed Care – PPO

## 2018-07-20 ENCOUNTER — Telehealth: Payer: Self-pay | Admitting: *Deleted

## 2018-07-20 DIAGNOSIS — R350 Frequency of micturition: Secondary | ICD-10-CM

## 2018-07-20 LAB — PAP IG W/ RFLX HPV ASCU

## 2018-07-20 LAB — WET PREP FOR TRICH, YEAST, CLUE

## 2018-07-20 MED ORDER — FLUCONAZOLE 150 MG PO TABS: 150.0000 mg | ORAL_TABLET | Freq: Every day | ORAL | 1 refills | Status: AC

## 2018-07-20 MED ORDER — SULFAMETHOXAZOLE-TRIMETHOPRIM 800-160 MG PO TABS: 1.0000 | ORAL_TABLET | Freq: Two times a day (BID) | ORAL | 0 refills | Status: DC

## 2018-07-20 MED ORDER — CIPROFLOXACIN HCL 500 MG PO TABS: 500.0000 mg | ORAL_TABLET | Freq: Two times a day (BID) | ORAL | 0 refills | Status: DC

## 2018-07-20 NOTE — Telephone Encounter (Signed)
Patient informed, Rx sent.  

## 2018-07-20 NOTE — Telephone Encounter (Signed)
[facility] received Cipro 500 mg dose, called stating Cipro has adverse reaction to hydroxychloroquine. Asked if another medication could be prescribed?

## 2018-07-20 NOTE — Telephone Encounter (Signed)
It was not in her chart allergy list!!!  Change to Bactrim DS 1 Tab PO BID x 3 days.

## 2018-07-20 NOTE — Telephone Encounter (Signed)
Patient was seen yesterday for annual exam, last night noticed frequent urination and burning with urination. States she left U/A, but urine was thrown out since not order yesterday. Patient coming today to leave u/a, she said that you told her to use hydrocortisone 1% cream to help with irritation, patient said the irritation is more so the clitoris, asked if okay to use the cream in this area? Please advise

## 2018-07-22 ENCOUNTER — Telehealth: Payer: Self-pay | Admitting: *Deleted

## 2018-07-22 DIAGNOSIS — R350 Frequency of micturition: Secondary | ICD-10-CM

## 2018-07-22 LAB — URINALYSIS, COMPLETE W/RFL CULTURE
Bilirubin Urine: NEGATIVE
Glucose, UA: NEGATIVE
Hyaline Cast: NONE SEEN /LPF
Ketones, ur: NEGATIVE
Nitrites, Initial: NEGATIVE
Protein, ur: NEGATIVE
Specific Gravity, Urine: 1.001 (ref 1.001–1.03)
pH: 5.5 (ref 5.0–8.0)

## 2018-07-22 LAB — URINE CULTURE
MICRO NUMBER:: 606848
Result:: NO GROWTH
SPECIMEN QUALITY:: ADEQUATE

## 2018-07-22 LAB — CULTURE INDICATED

## 2018-07-22 MED ORDER — SULFAMETHOXAZOLE-TRIMETHOPRIM 800-160 MG PO TABS: 1.0000 | ORAL_TABLET | Freq: Two times a day (BID) | ORAL | 0 refills | Status: DC

## 2018-07-22 NOTE — Telephone Encounter (Signed)
Patient received urine culture results has 1 day left on medication Bactrim DS, drinking lots of fluids, taking cranberry supplements, feel better but not 100%. Still has urgency sensation, urine still cloudy, slight burning as well. She asked if you have any other recommendations? Please advise

## 2018-07-22 NOTE — Telephone Encounter (Signed)
Dr. Phineas Real please see the below, this is [facility] patient, she is not having pain, but still has mild symptoms, she wanted to know if any other recommendations to help? Please advise

## 2018-07-22 NOTE — Telephone Encounter (Signed)
Patient informed, Rx sent.  

## 2018-07-22 NOTE — Telephone Encounter (Signed)
Assuming she is feeling better but symptoms not totally resolved and I would extend the Bactrim course to 7-day.  I would give her an additional 4 days and this will get her through the weekend and hopefully resolve her UTI.  If any lingering symptoms beginning of next week then I would recommend either office visit or at least dropping off a urine for urine analysis and culture.

## 2018-07-25 ENCOUNTER — Encounter: Payer: Self-pay | Admitting: Obstetrics & Gynecology

## 2018-07-25 NOTE — Patient Instructions (Signed)
1. Encounter for routine gynecological examination with Papanicolaou smear of cervix Normal gynecologic exam.  Pap reflex done.  Breast exam normal.  Good body mass index at 22.31.  Continue with fitness and healthy nutrition.  2. Encounter for routine checking of intrauterine contraceptive device (IUD) Well on Mirena IUD since November 2017.  IUD strings not visualized on exam.  Will come back for pelvic ultrasound to locate the Mirena IUD.  3. Intrauterine contraceptive device threads lost, initial encounter Follow-up with pelvic ultrasound to locate the Mirena IUD. - US Transvaginal Non-OB; Future  4. Pelvic pain in female Probable cystitis per urine analysis.  Urine culture pending.  Will treat with Cipro 500 mg BID x 5 days.  Usage and risks reviewed.  Prescription sent to pharmacy.  Fluconazole as needed after the ABTx.  Prescription sent to pharmacy. - US Transvaginal Non-OB; Future - U/A, Rfx Urine Culture  5. Vaginal pruritus No evidence of yeast vaginitis on wet prep.  Will try Hydrocortisone !% cream. - WET PREP FOR TRICH, YEAST, CLUE  Other orders - Probiotic Product (PROBIOTIC PO); Take 1 tablet by mouth daily. - cetirizine (ZYRTEC) 10 MG tablet; Take 10 mg by mouth daily. - levonorgestrel (MIRENA) 20 MCG/24HR IUD; 1 each by Intrauterine route once. - ciprofloxacin (CIPRO) 500 MG tablet; Take 1 tablet (500 mg total) by mouth 2 (two) times daily for 5 days. - fluconazole (DIFLUCAN) 150 MG tablet; Take 1 tablet (150 mg total) by mouth daily for 3 days.  Roberta Gillespie, it was a pleasure seeing you today!  I will inform you of your results as soon as they are available.

## 2018-07-28 ENCOUNTER — Other Ambulatory Visit: Payer: Self-pay

## 2018-07-28 ENCOUNTER — Encounter (HOSPITAL_BASED_OUTPATIENT_CLINIC_OR_DEPARTMENT_OTHER): Payer: Self-pay | Admitting: *Deleted

## 2018-07-29 ENCOUNTER — Other Ambulatory Visit: Payer: BC Managed Care – PPO

## 2018-07-29 DIAGNOSIS — R350 Frequency of micturition: Secondary | ICD-10-CM

## 2018-07-30 LAB — URINALYSIS, COMPLETE W/RFL CULTURE
Bacteria, UA: NONE SEEN /HPF
Bilirubin Urine: NEGATIVE
Glucose, UA: NEGATIVE
Hgb urine dipstick: NEGATIVE
Hyaline Cast: NONE SEEN /LPF
Ketones, ur: NEGATIVE
Leukocyte Esterase: NEGATIVE
Nitrites, Initial: NEGATIVE
Protein, ur: NEGATIVE
RBC / HPF: NONE SEEN /HPF (ref 0–2)
Specific Gravity, Urine: 1.011 (ref 1.001–1.03)
Squamous Epithelial / HPF: NONE SEEN /HPF (ref <=5)
WBC, UA: NONE SEEN /HPF (ref 0–5)
pH: 6 (ref 5.0–8.0)

## 2018-07-30 LAB — NO CULTURE INDICATED

## 2018-08-02 ENCOUNTER — Other Ambulatory Visit (HOSPITAL_COMMUNITY)
Admission: RE | Admit: 2018-08-02 | Discharge: 2018-08-02 | Disposition: A | Discharge status: Home / Self Care | Payer: BC Managed Care – PPO | Source: Ambulatory Visit | Attending: Otolaryngology | Admitting: Otolaryngology

## 2018-08-02 DIAGNOSIS — Z1159 Encounter for screening for other viral diseases: Secondary | ICD-10-CM | POA: Insufficient documentation

## 2018-08-02 DIAGNOSIS — Z01812 Encounter for preprocedural laboratory examination: Secondary | ICD-10-CM | POA: Insufficient documentation

## 2018-08-02 LAB — SARS CORONAVIRUS 2 (TAT 6-24 HRS): SARS Coronavirus 2: NEGATIVE

## 2018-08-05 ENCOUNTER — Other Ambulatory Visit: Payer: Self-pay

## 2018-08-05 ENCOUNTER — Ambulatory Visit (HOSPITAL_BASED_OUTPATIENT_CLINIC_OR_DEPARTMENT_OTHER): Payer: BC Managed Care – PPO | Admitting: Anesthesiology

## 2018-08-05 ENCOUNTER — Encounter (HOSPITAL_BASED_OUTPATIENT_CLINIC_OR_DEPARTMENT_OTHER)
Admission: RE | Disposition: A | Discharge status: Home / Self Care | Payer: Self-pay | Source: Home / Self Care | Attending: Otolaryngology

## 2018-08-05 ENCOUNTER — Ambulatory Visit (HOSPITAL_BASED_OUTPATIENT_CLINIC_OR_DEPARTMENT_OTHER)
Admission: RE | Admit: 2018-08-05 | Discharge: 2018-08-05 | Disposition: A | Discharge status: Home / Self Care | Payer: BC Managed Care – PPO | Attending: Otolaryngology | Admitting: Otolaryngology

## 2018-08-05 ENCOUNTER — Encounter (HOSPITAL_BASED_OUTPATIENT_CLINIC_OR_DEPARTMENT_OTHER): Payer: Self-pay | Admitting: *Deleted

## 2018-08-05 DIAGNOSIS — R51 Headache: Secondary | ICD-10-CM | POA: Insufficient documentation

## 2018-08-05 DIAGNOSIS — I7300 Raynaud's syndrome without gangrene: Secondary | ICD-10-CM | POA: Insufficient documentation

## 2018-08-05 DIAGNOSIS — J342 Deviated nasal septum: Secondary | ICD-10-CM | POA: Insufficient documentation

## 2018-08-05 DIAGNOSIS — J3489 Other specified disorders of nose and nasal sinuses: Secondary | ICD-10-CM | POA: Diagnosis not present

## 2018-08-05 DIAGNOSIS — J343 Hypertrophy of nasal turbinates: Secondary | ICD-10-CM | POA: Diagnosis present

## 2018-08-05 DIAGNOSIS — R0981 Nasal congestion: Secondary | ICD-10-CM | POA: Insufficient documentation

## 2018-08-05 DIAGNOSIS — J45909 Unspecified asthma, uncomplicated: Secondary | ICD-10-CM | POA: Insufficient documentation

## 2018-08-05 HISTORY — DX: Other specified postprocedural states: R11.2

## 2018-08-05 HISTORY — DX: Other specified postprocedural states: Z98.890

## 2018-08-05 HISTORY — DX: Benign paroxysmal vertigo, bilateral: H81.13

## 2018-08-05 HISTORY — PX: NASAL SEPTOPLASTY W/ TURBINOPLASTY: SHX2070

## 2018-08-05 LAB — POCT PREGNANCY, URINE: Preg Test, Ur: NEGATIVE

## 2018-08-05 SURGERY — SEPTOPLASTY, NOSE, WITH NASAL TURBINATE REDUCTION
Anesthesia: General | Laterality: Bilateral

## 2018-08-05 MED ORDER — FENTANYL CITRATE (PF) 100 MCG/2ML IJ SOLN
INTRAMUSCULAR | Status: AC
  Filled 2018-08-05: qty 2

## 2018-08-05 MED ORDER — PROPOFOL 10 MG/ML IV BOLUS
INTRAVENOUS | Status: DC | PRN
  Administered 2018-08-05 (×3): 30 mg via INTRAVENOUS
  Administered 2018-08-05: 50 mg via INTRAVENOUS
  Administered 2018-08-05: 150 mg via INTRAVENOUS
  Administered 2018-08-05: 50 mg via INTRAVENOUS

## 2018-08-05 MED ORDER — CEFAZOLIN SODIUM-DEXTROSE 2-3 GM-%(50ML) IV SOLR
INTRAVENOUS | Status: DC | PRN
  Administered 2018-08-05: 2 g via INTRAVENOUS

## 2018-08-05 MED ORDER — PROPOFOL 10 MG/ML IV BOLUS
INTRAVENOUS | Status: AC
  Filled 2018-08-05: qty 20

## 2018-08-05 MED ORDER — SUCCINYLCHOLINE CHLORIDE 20 MG/ML IJ SOLN
INTRAMUSCULAR | Status: DC | PRN
  Administered 2018-08-05: 100 mg via INTRAVENOUS

## 2018-08-05 MED ORDER — ONDANSETRON HCL 4 MG/2ML IJ SOLN
INTRAMUSCULAR | Status: DC | PRN
  Administered 2018-08-05: 4 mg via INTRAVENOUS

## 2018-08-05 MED ORDER — OXYCODONE-ACETAMINOPHEN 5-325 MG PO TABS: 1.0000 | ORAL_TABLET | ORAL | 0 refills | Status: AC | PRN

## 2018-08-05 MED ORDER — OXYMETAZOLINE HCL 0.05 % NA SOLN
NASAL | Status: DC | PRN
  Administered 2018-08-05: 1 via TOPICAL

## 2018-08-05 MED ORDER — ROCURONIUM BROMIDE 10 MG/ML (PF) SYRINGE
PREFILLED_SYRINGE | INTRAVENOUS | Status: AC
  Filled 2018-08-05: qty 10

## 2018-08-05 MED ORDER — SUGAMMADEX SODIUM 200 MG/2ML IV SOLN
INTRAVENOUS | Status: DC | PRN
  Administered 2018-08-05: 150 mg via INTRAVENOUS

## 2018-08-05 MED ORDER — FENTANYL CITRATE (PF) 100 MCG/2ML IJ SOLN
25.0000 ug | INTRAMUSCULAR | Status: DC | PRN
  Administered 2018-08-05 (×2): 50 ug via INTRAVENOUS

## 2018-08-05 MED ORDER — ONDANSETRON HCL 4 MG/2ML IJ SOLN
INTRAMUSCULAR | Status: AC
  Filled 2018-08-05: qty 2

## 2018-08-05 MED ORDER — SCOPOLAMINE 1 MG/3DAYS TD PT72
MEDICATED_PATCH | TRANSDERMAL | Status: AC
  Filled 2018-08-05: qty 2

## 2018-08-05 MED ORDER — PROPOFOL 500 MG/50ML IV EMUL
INTRAVENOUS | Status: DC | PRN
  Administered 2018-08-05: 125 ug/kg/min via INTRAVENOUS

## 2018-08-05 MED ORDER — AMOXICILLIN 875 MG PO TABS: 875.0000 mg | ORAL_TABLET | Freq: Two times a day (BID) | ORAL | 0 refills | Status: AC

## 2018-08-05 MED ORDER — FENTANYL CITRATE (PF) 100 MCG/2ML IJ SOLN
50.0000 ug | INTRAMUSCULAR | Status: DC | PRN
  Administered 2018-08-05 (×2): 50 ug via INTRAVENOUS

## 2018-08-05 MED ORDER — PROMETHAZINE HCL 25 MG/ML IJ SOLN: 6.2500 mg | INTRAMUSCULAR | Status: DC | PRN

## 2018-08-05 MED ORDER — LIDOCAINE-EPINEPHRINE 1 %-1:100000 IJ SOLN
INTRAMUSCULAR | Status: AC
  Filled 2018-08-05: qty 1

## 2018-08-05 MED ORDER — LIDOCAINE-EPINEPHRINE 1 %-1:100000 IJ SOLN
INTRAMUSCULAR | Status: DC | PRN
  Administered 2018-08-05: 3 mL

## 2018-08-05 MED ORDER — LIDOCAINE 2% (20 MG/ML) 5 ML SYRINGE
INTRAMUSCULAR | Status: AC
  Filled 2018-08-05: qty 5

## 2018-08-05 MED ORDER — MIDAZOLAM HCL 2 MG/2ML IJ SOLN
1.0000 mg | INTRAMUSCULAR | Status: DC | PRN
  Administered 2018-08-05: 2 mg via INTRAVENOUS

## 2018-08-05 MED ORDER — DEXMEDETOMIDINE HCL 200 MCG/2ML IV SOLN
INTRAVENOUS | Status: DC | PRN
  Administered 2018-08-05: 8 ug via INTRAVENOUS
  Administered 2018-08-05: 12 ug via INTRAVENOUS

## 2018-08-05 MED ORDER — DEXAMETHASONE SODIUM PHOSPHATE 4 MG/ML IJ SOLN
INTRAMUSCULAR | Status: DC | PRN
  Administered 2018-08-05: 10 mg via INTRAVENOUS

## 2018-08-05 MED ORDER — DEXAMETHASONE SODIUM PHOSPHATE 10 MG/ML IJ SOLN
INTRAMUSCULAR | Status: AC
  Filled 2018-08-05: qty 1

## 2018-08-05 MED ORDER — OXYCODONE HCL 5 MG PO TABS
5.0000 mg | ORAL_TABLET | Freq: Once | ORAL | Status: AC
  Administered 2018-08-05: 5 mg via ORAL

## 2018-08-05 MED ORDER — SUCCINYLCHOLINE CHLORIDE 200 MG/10ML IV SOSY
PREFILLED_SYRINGE | INTRAVENOUS | Status: AC
  Filled 2018-08-05: qty 10

## 2018-08-05 MED ORDER — MIDAZOLAM HCL 2 MG/2ML IJ SOLN
INTRAMUSCULAR | Status: AC
  Filled 2018-08-05: qty 2

## 2018-08-05 MED ORDER — SCOPOLAMINE 1 MG/3DAYS TD PT72
1.0000 | MEDICATED_PATCH | Freq: Once | TRANSDERMAL | Status: AC
  Administered 2018-08-05: 1 via TRANSDERMAL

## 2018-08-05 MED ORDER — MUPIROCIN 2 % EX OINT
TOPICAL_OINTMENT | CUTANEOUS | Status: DC | PRN
  Administered 2018-08-05: 1 via NASAL

## 2018-08-05 MED ORDER — OXYCODONE HCL 5 MG PO TABS
ORAL_TABLET | ORAL | Status: AC
  Filled 2018-08-05: qty 1

## 2018-08-05 MED ORDER — LACTATED RINGERS IV SOLN
INTRAVENOUS | Status: DC
  Administered 2018-08-05 (×2): via INTRAVENOUS

## 2018-08-05 MED ORDER — LIDOCAINE 2% (20 MG/ML) 5 ML SYRINGE
INTRAMUSCULAR | Status: DC | PRN
  Administered 2018-08-05: 60 mg via INTRAVENOUS

## 2018-08-05 MED ORDER — ROCURONIUM BROMIDE 10 MG/ML (PF) SYRINGE
PREFILLED_SYRINGE | INTRAVENOUS | Status: DC | PRN
  Administered 2018-08-05: 30 mg via INTRAVENOUS

## 2018-08-05 SURGICAL SUPPLY — 30 items
ATTRACTOMAT 16X20 MAGNETIC DRP (DRAPES) IMPLANT
CANISTER SUCT 1200ML W/VALVE (MISCELLANEOUS) ×2 IMPLANT
COAGULATOR SUCT 8FR VV (MISCELLANEOUS) ×2 IMPLANT
COVER WAND RF STERILE (DRAPES) IMPLANT
DECANTER SPIKE VIAL GLASS SM (MISCELLANEOUS) ×2 IMPLANT
DRSG NASOPORE 8CM (GAUZE/BANDAGES/DRESSINGS) IMPLANT
DRSG TELFA 3X8 NADH (GAUZE/BANDAGES/DRESSINGS) IMPLANT
ELECT REM PT RETURN 9FT ADLT (ELECTROSURGICAL) ×2
ELECTRODE REM PT RTRN 9FT ADLT (ELECTROSURGICAL) ×1 IMPLANT
GLOVE SURG SS PI 7.5 STRL IVOR (GLOVE) IMPLANT
GOWN STRL REUS W/ TWL LRG LVL3 (GOWN DISPOSABLE) ×2 IMPLANT
GOWN STRL REUS W/TWL LRG LVL3 (GOWN DISPOSABLE) ×2
NEEDLE HYPO 25X1 1.5 SAFETY (NEEDLE) ×2 IMPLANT
NS IRRIG 1000ML POUR BTL (IV SOLUTION) ×2 IMPLANT
PACK BASIN DAY SURGERY FS (CUSTOM PROCEDURE TRAY) ×2 IMPLANT
PACK ENT DAY SURGERY (CUSTOM PROCEDURE TRAY) ×2 IMPLANT
SLEEVE SCD COMPRESS KNEE MED (MISCELLANEOUS) IMPLANT
SOLUTION BUTLER CLEAR DIP (MISCELLANEOUS) ×2 IMPLANT
SPLINT NASAL AIRWAY SILICONE (MISCELLANEOUS) IMPLANT
SPONGE GAUZE 2X2 8PLY STRL LF (GAUZE/BANDAGES/DRESSINGS) ×2 IMPLANT
SPONGE NEURO XRAY DETECT 1X3 (DISPOSABLE) ×2 IMPLANT
SUT CHROMIC 4 0 P 3 18 (SUTURE) ×2 IMPLANT
SUT PLAIN 4 0 ~~LOC~~ 1 (SUTURE) ×2 IMPLANT
SUT PROLENE 3 0 PS 2 (SUTURE) ×2 IMPLANT
SUT VIC AB 4-0 P-3 18XBRD (SUTURE) IMPLANT
SUT VIC AB 4-0 P3 18 (SUTURE)
TOWEL GREEN STERILE FF (TOWEL DISPOSABLE) ×2 IMPLANT
TUBE SALEM SUMP 12R W/ARV (TUBING) IMPLANT
TUBE SALEM SUMP 16 FR W/ARV (TUBING) ×2 IMPLANT
YANKAUER SUCT BULB TIP NO VENT (SUCTIONS) ×2 IMPLANT

## 2018-08-05 NOTE — Anesthesia Procedure Notes (Signed)
Procedure Name: Intubation Date/Time: 08/05/2018 9:47 AM Performed by: Lieutenant Diego, CRNA Pre-anesthesia Checklist: Patient identified, Emergency Drugs available, Suction available and Patient being monitored Patient Re-evaluated:Patient Re-evaluated prior to induction Oxygen Delivery Method: Circle system utilized Preoxygenation: Pre-oxygenation with 100% oxygen Induction Type: IV induction Ventilation: Mask ventilation without difficulty Laryngoscope Size: Miller and 2 Grade View: Grade I Tube type: Oral Tube size: 7.0 mm Number of attempts: 1 Airway Equipment and Method: Stylet and Oral airway Placement Confirmation: ETT inserted through vocal cords under direct vision,  positive ETCO2 and breath sounds checked- equal and bilateral Secured at: 22 cm Tube secured with: Tape Dental Injury: Teeth and Oropharynx as per pre-operative assessment

## 2018-08-05 NOTE — H&P (Signed)
Cc: Chronic nasal obstruction  HPI: The patient is seen today for follow up evaluation of her chronic nasal congestion and recurrent sinusitis. The patient was last seen one month ago. At that time, she was completing of for recurrent facial pain, nasal congestion, and recurrent headaches. She was noted to have chronic rhinitis with nasal mucosal congestion, septal deviation, and bilateral inferior turbinate hypertrophy. She was continued on her allergy treatment regimen of Flonase nasal spray, Xyzol, Singulair, Zyrtec. She would previously treated with multiple courses of antibiotics. She also underwent a sinus CT scan. The CT showed no significant sinusitis. However the CT showed severe bidirectional nasal septal deviation and bilateral inferior turbinate hypertrophy. The patient reports persistent nasal congestion bilaterally. Currently she has no significant facial pain, fever, or visual change.  Exam: General: Communicates without difficulty, well nourished, no acute distress. Head: Normocephalic, no evidence injury, no tenderness, facial buttresses intact without stepoff. Eyes: PERRL, EOMI. No scleral icterus, conjunctivae clear. Neuro: CN II exam reveals vision grossly intact.  No nystagmus at any point of gaze. Ears: Auricles well formed without lesions.  Ear canals are intact without mass or lesion.  No erythema or edema is appreciated.  The TMs are intact without fluid. Nose: External evaluation reveals normal support and skin without lesions.  Dorsum is intact.  Anterior rhinoscopy reveals severely congested and edematous mucosa over anterior aspect of the inferior turbinates and deviated nasal septum.  No purulence is noted. Middle meatus is not well visualized.Oral:  Oral cavity and oropharynx are intact, symmetric, without erythema or edema.  Mucosa is moist without lesions. Neck: Full range of motion without pain.  There is no significant lymphadenopathy.  No masses palpable.  Thyroid bed within  normal limits to palpation.  Parotid glands and submandibular glands equal bilaterally without mass.  Trachea is midline. Neuro:  CN 2-12 grossly intact. Gait normal. Vestibular: No nystagmus at any point of gaze.   Assessment 1. The patient's CT scan showed bidirectional nasal septal deviation and bilateral inferior turbinate hypertrophy. Her right septal spur was impinging on the right inferior turbinate, likely causing contact point headache. 2. No significant acute or chronic sinusitis is noted on the CT scan.  Plan  1. The CT scan images are extensively discussed with the patient. 2. The patient is reassured that no significant acute or chronic sinusitis is noted on the CT scan. 3. She should continue with her allergy treatment regimen. 4. Nasal saline irrigations daily. 5. The option of surgical intervention with septoplasty and turbinate reduction is discussed. The risks, benefits, alternatives, and details of the procedures are extensively reviewed. Questions are invited and answered. 6. The patient is interested in proceeding with the procedures.

## 2018-08-05 NOTE — Transfer of Care (Signed)
Immediate Anesthesia Transfer of Care Note  Patient: Roberta Gillespie  Procedure(s) Performed: NASAL SEPTOPLASTY WITH BILATERAL TURBINATE REDUCTION (Bilateral )  Patient Location: PACU  Anesthesia Type:General  Level of Consciousness: awake  Airway & Oxygen Therapy: Patient Spontanous Breathing and Patient connected to face mask oxygen  Post-op Assessment: Report given to RN and Post -op Vital signs reviewed and stable  Post vital signs: Reviewed and stable  Last Vitals:  Vitals Value Taken Time  BP 113/56 08/05/18 1103  Temp    Pulse 69 08/05/18 1104  Resp 20 08/05/18 1104  SpO2 100 % 08/05/18 1104  Vitals shown include unvalidated device data.  Last Pain:  Vitals:   08/05/18 0835  TempSrc: Oral  PainSc: 0-No pain         Complications: No apparent anesthesia complications

## 2018-08-05 NOTE — Anesthesia Postprocedure Evaluation (Signed)
Anesthesia Post Note  Patient: Roberta Gillespie  Procedure(s) Performed: NASAL SEPTOPLASTY WITH BILATERAL TURBINATE REDUCTION (Bilateral )     Patient location during evaluation: PACU Anesthesia Type: General Level of consciousness: awake and alert Pain management: pain level controlled Vital Signs Assessment: post-procedure vital signs reviewed and stable Respiratory status: spontaneous breathing, nonlabored ventilation and respiratory function stable Cardiovascular status: blood pressure returned to baseline and stable Postop Assessment: no apparent nausea or vomiting Anesthetic complications: no    Last Vitals:  Vitals:   08/05/18 1215 08/05/18 1229  BP: 108/72   Pulse: (!) 48 69  Resp: 14 16  Temp:    SpO2: 99% 100%    Last Pain:  Vitals:   08/05/18 1137  TempSrc:   PainSc: [place] Shardae Kleinman

## 2018-08-05 NOTE — Op Note (Signed)
DATE OF PROCEDURE: 08/05/2018  OPERATIVE REPORT   SURGEON: Leta Baptist, MD   PREOPERATIVE DIAGNOSES:  1. Severe nasal septal deviation.  2. Bilateral inferior turbinate hypertrophy.  3. Chronic nasal obstruction.  POSTOPERATIVE DIAGNOSES:  1. Severe nasal septal deviation.  2. Bilateral inferior turbinate hypertrophy.  3. Chronic nasal obstruction.  PROCEDURE PERFORMED:  1. Septoplasty.  2. Bilateral partial inferior turbinate resection.   ANESTHESIA: General endotracheal tube anesthesia.   COMPLICATIONS: None.   ESTIMATED BLOOD LOSS: 100 mL.   INDICATION FOR PROCEDURE: Roberta Gillespie is a 23 y.o. female with a history of chronic nasal obstruction. The patient was treated with antihistamine, decongestant, and steroid nasal spray. However, the patient continued to be symptomatic. On examination, the patient was noted to have bilateral severe inferior turbinate hypertrophy and significant nasal septal deviation, causing significant nasal obstruction. Based on the above findings, the decision was made for the patient to undergo the above-stated procedures. The risks, benefits, alternatives, and details of the procedures were discussed with the patient. Questions were invited and answered. Informed consent was obtained.   DESCRIPTION OF PROCEDURE: The patient was taken to the operating room and placed supine on the operating table. General endotracheal tube anesthesia was administered by the anesthesiologist. The patient was positioned, and prepped and draped in the standard fashion for nasal surgery. Pledgets soaked with Afrin were placed in both nasal cavities for decongestion. The pledgets were subsequently removed.   Examination of the nasal cavity revealed a severe nasal septal deviationd. 1% lidocaine with 1:100,000 epinephrine was injected onto the nasal septum bilaterally. A hemitransfixion incision was made on the left side. The mucosal flap was carefully elevated on the left side. A  cartilaginous incision was made 1 cm superior to the caudal margin of the nasal septum. Mucosal flap was also elevated on the right side in the similar fashion. It should be noted that due to the severe septal deviation, the deviated portion of the cartilaginous and bony septum had to be removed in piecemeal fashion. Once the deviated portions were removed, a straight midline septum was achieved. The septum was then quilted with 4-0 plain gut sutures. The hemitransfixion incision was closed with interrupted 4-0 chromic sutures.   The inferior one half of both hypertrophied inferior turbinate was crossclamped with a Kelly clamp. The inferior one half of each inferior turbinate was then resected with a pair of cross cutting scissors. Hemostasis was achieved with a suction cautery device. Doyle splints were applied to the nasal septum.  The care of the patient was turned over to the anesthesiologist. The patient was awakened from anesthesia without difficulty. The patient was extubated and transferred to the recovery room in good condition.   OPERATIVE FINDINGS: Severe nasal septal deviation and bilateral inferior turbinate hypertrophy.   SPECIMEN: None.   FOLLOWUP CARE: The patient be discharged home once she is awake and alert. The patient will be placed on Percocet 1 tablets p.o. q.4 hours p.r.n. pain, and amoxicillin 875 mg p.o. b.i.d. for 5 days. The patient will follow up in my office in approximately 1 week for splint removal.   Macio Kissoon Raynelle Bring, MD

## 2018-08-05 NOTE — Addendum Note (Signed)
Addendum  created 08/05/18 1401 by Lieutenant Diego, CRNA   Intraprocedure Meds edited

## 2018-08-05 NOTE — Discharge Instructions (Addendum)

## 2018-08-05 NOTE — Anesthesia Preprocedure Evaluation (Addendum)
Anesthesia Evaluation  Patient identified by MRN, date of birth, ID band Patient awake    Reviewed: Allergy & Precautions, NPO status , Patient's Chart, lab work & pertinent test results  History of Anesthesia Complications (+) PONV and history of anesthetic complications  Airway Mallampati: I  TM Distance: >3 FB Neck ROM: Full    Dental  (+) Dental Advisory Given, Teeth Intact   Pulmonary asthma ,    breath sounds clear to auscultation       Cardiovascular negative cardio ROS   Rhythm:Regular Rate:Normal     Neuro/Psych  BPPV  negative psych ROS   GI/Hepatic negative GI ROS, Neg liver ROS,   Endo/Other  negative endocrine ROS  Renal/GU negative Renal ROS     Musculoskeletal  Connective tissue disease    Abdominal   Peds  Hematology negative hematology ROS (+)   Anesthesia Other Findings Raynaud's phenomena  Reproductive/Obstetrics                            Anesthesia Physical Anesthesia Plan  ASA: II  Anesthesia Plan: General   Post-op Pain Management:    Induction: Intravenous  PONV Risk Score and Plan: 4 or greater and Treatment may vary due to age or medical condition, Ondansetron, Dexamethasone, Midazolam and Scopolamine patch - Pre-op  Airway Management Planned: Oral ETT  Additional Equipment: None  Intra-op Plan:   Post-operative Plan: Extubation in OR  Informed Consent: I have reviewed the patients History and Physical, chart, labs and discussed the procedure including the risks, benefits and alternatives for the proposed anesthesia with the patient or authorized representative who has indicated his/her understanding and acceptance.     Dental advisory given  Plan Discussed with: CRNA and Anesthesiologist  Anesthesia Plan Comments:        Anesthesia Quick Evaluation

## 2018-08-08 ENCOUNTER — Ambulatory Visit (INDEPENDENT_AMBULATORY_CARE_PROVIDER_SITE_OTHER): Payer: BC Managed Care – PPO | Admitting: Otolaryngology

## 2018-08-08 ENCOUNTER — Encounter (HOSPITAL_BASED_OUTPATIENT_CLINIC_OR_DEPARTMENT_OTHER): Payer: Self-pay | Admitting: Otolaryngology

## 2018-08-17 ENCOUNTER — Ambulatory Visit: Payer: BC Managed Care – PPO | Admitting: Obstetrics & Gynecology

## 2018-08-17 ENCOUNTER — Encounter: Payer: Self-pay | Admitting: Obstetrics & Gynecology

## 2018-08-17 ENCOUNTER — Other Ambulatory Visit: Payer: Self-pay

## 2018-08-17 VITALS — BP 112/68

## 2018-08-17 DIAGNOSIS — N3090 Cystitis, unspecified without hematuria: Secondary | ICD-10-CM | POA: Diagnosis not present

## 2018-08-17 DIAGNOSIS — R300 Dysuria: Secondary | ICD-10-CM

## 2018-08-17 DIAGNOSIS — R102 Pelvic and perineal pain: Secondary | ICD-10-CM | POA: Diagnosis not present

## 2018-08-17 LAB — WET PREP FOR TRICH, YEAST, CLUE

## 2018-08-17 MED ORDER — NITROFURANTOIN MONOHYD MACRO 100 MG PO CAPS: 100.0000 mg | ORAL_CAPSULE | Freq: Once | ORAL | 2 refills | Status: AC

## 2018-08-17 MED ORDER — NITROFURANTOIN MONOHYD MACRO 100 MG PO CAPS: 100.0000 mg | ORAL_CAPSULE | Freq: Two times a day (BID) | ORAL | 0 refills | Status: AC

## 2018-08-17 NOTE — Patient Instructions (Signed)
1. Dysuria Probable acute cystitis per clinical symptoms and urine analysis.  Recent treatment with Bactrim.  We will therefore treat with nitrofurantoin this time, 1 tablet twice a day for 5 days.  Usage reviewed and prescription sent to pharmacy.  Recommend pushing p.o. water intake.  Precautions reviewed.  2. Suprapubic pain Probable acute cystitis.  Will treat with nitrofurantoin 1 tablet twice a day for 5 days.   3. Recurrent cystitis Decision to start antibioprophylaxis with nitrofurantoin 1 tablet per mouth prior to intercourse.  We will also take the habit of passing urine after intercourse.  Usage reviewed and prescription sent to pharmacy.  [place], it was a pleasure seeing you today!  I will inform you of your results as soon as they are available.

## 2018-08-17 NOTE — Addendum Note (Signed)
Addended by: Thurnell Garbe A on: 08/17/2018 12:38 PM   Modules accepted: Orders

## 2018-08-17 NOTE — Progress Notes (Signed)
    Roberta Gillespie 09/13/1995 [id number]        23 y.o.  G0 Single.  Stable boyfriend x 2 years  RP: Suprapubic/vaginal burning, urgency and frequency of urine x 2 days  HPI: Suprapubic/vaginal burning, urgency and frequency of urine x 2 days.  No back pain or flank pain.  No blood seen in urine.  No fever.  Feels that the symptoms started after intercourse.  Patient had a probable acute cystitis July 20, 2018, but urine culture came back negative.  She was treated with Bactrim at that time.  Previous acute cystitis a year ago.  Gonorrhea and chlamydia were negative March 2019.  Patient declines STI screen.   OB History  Gravida Para Term Preterm AB Living  0 0 0 0 0 0  SAB TAB Ectopic Multiple Live Births  0 0 0 0 0    Past medical history,surgical history, problem list, medications, allergies, family history and social history were all reviewed and documented in the EPIC chart.   Directed ROS with pertinent positives and negatives documented in the history of present illness/assessment and plan.  Exam:  Vitals:   08/17/18 1129  BP: 112/68   General appearance:  Normal  Abdomen: Normal  CVAT negative bilaterally  Gynecologic exam: Vulva normal.  Speculum:  Cervix/Vagina normal.  Wet prep done.  Bimanual exam: Uterus anteverted, normal volume, mobile, nontender.  No adnexal mass felt, nontender bilaterally.  Wet prep negative  U/A: Yellow cloudy, protein negative, nitrites negative, white blood cells more than 60, red blood cells 20-40, many bacteria.  Urine culture pending.   Assessment/Plan:  23 y.o. G0  1. Dysuria Probable acute cystitis per clinical symptoms and urine analysis.  Recent treatment with Bactrim.  We will therefore treat with nitrofurantoin this time, 1 tablet twice a day for 5 days.  Usage reviewed and prescription sent to pharmacy.  Recommend pushing p.o. water intake.  Precautions reviewed.  2. Suprapubic pain Probable acute cystitis.  Will treat  with nitrofurantoin 1 tablet twice a day for 5 days.   3. Recurrent cystitis Decision to start antibioprophylaxis with nitrofurantoin 1 tablet per mouth prior to intercourse.  We will also take the habit of passing urine after intercourse.  Usage reviewed and prescription sent to pharmacy.  Counseling on above issues and coordination of care more than 50% for 15 minutes.  [name] MD, 11:59 AM 08/17/2018

## 2018-08-19 LAB — URINALYSIS, COMPLETE W/RFL CULTURE
Bilirubin Urine: NEGATIVE
Glucose, UA: NEGATIVE
Hyaline Cast: NONE SEEN /LPF
Ketones, ur: NEGATIVE
Nitrites, Initial: NEGATIVE
Protein, ur: NEGATIVE
Specific Gravity, Urine: 1.015 (ref 1.001–1.03)
WBC, UA: >=60 /HPF — AB (ref 0–5)
pH: 6 (ref 5.0–8.0)

## 2018-08-19 LAB — URINE CULTURE
MICRO NUMBER:: 696721
SPECIMEN QUALITY:: ADEQUATE

## 2018-08-19 LAB — CULTURE INDICATED

## 2018-08-22 ENCOUNTER — Other Ambulatory Visit: Payer: BC Managed Care – PPO

## 2018-08-22 ENCOUNTER — Ambulatory Visit: Payer: BC Managed Care – PPO | Admitting: Obstetrics & Gynecology

## 2018-08-24 ENCOUNTER — Encounter: Payer: Self-pay | Admitting: Allergy

## 2018-08-24 ENCOUNTER — Other Ambulatory Visit: Payer: Self-pay

## 2018-08-24 ENCOUNTER — Ambulatory Visit: Payer: BC Managed Care – PPO | Admitting: Allergy

## 2018-08-24 VITALS — BP 96/56 | HR 68 | Temp 98.0°F | Resp 16 | Ht 62.0 in | Wt 119.0 lb

## 2018-08-24 DIAGNOSIS — J45909 Unspecified asthma, uncomplicated: Secondary | ICD-10-CM | POA: Insufficient documentation

## 2018-08-24 DIAGNOSIS — J4520 Mild intermittent asthma, uncomplicated: Secondary | ICD-10-CM | POA: Diagnosis not present

## 2018-08-24 DIAGNOSIS — J3089 Other allergic rhinitis: Secondary | ICD-10-CM

## 2018-08-24 MED ORDER — ALBUTEROL SULFATE HFA 108 (90 BASE) MCG/ACT IN AERS: 2.0000 | INHALATION_SPRAY | RESPIRATORY_TRACT | 1 refills | Status: AC | PRN

## 2018-08-24 MED ORDER — BREO ELLIPTA 100-25 MCG/INH IN AEPB: 1.0000 | INHALATION_SPRAY | Freq: Every day | RESPIRATORY_TRACT | 1 refills | Status: DC

## 2018-08-24 MED ORDER — AZELASTINE-FLUTICASONE 137-50 MCG/ACT NA SUSP: 1.0000 | Freq: Two times a day (BID) | NASAL | 5 refills | Status: DC

## 2018-08-24 NOTE — Progress Notes (Signed)
New Patient Note  RE: Roberta Gillespie MRN: [id number] DOB: 05/02/1995 Date of Office Visit: 08/24/2018  Referring provider: Farris HasMorrow, Aaron, MD Primary care provider: Farris HasMorrow, Aaron, MD  Chief Complaint: Allergic Rhinitis  (dogs, grass, dust mite )  History of Present Illness: I had the pleasure of seeing Roberta Gillespie for initial evaluation at the [facility] on 08/24/2018. She is a 23 y.o. female, who is self-referred here for the evaluation of allergies and asthma.   Patient was seen in [facility] in the past for allergic rhinitis and ? Asthma - in process of obtaining records. She moved to the area in 2018 and she was having issues with rhinitis.  She reports symptoms of rhinitis, nasal congestion, sneezing, PND, dry cough, itchy eyes. Symptoms have been going on for 15+ years. The symptoms are present all year around with worsening in spring through fall. Other triggers include exposure to dogs. Anosmia: no. Headache: yes. She has used zyrtec, azelastine, Flonase with some improvement in symptoms. Sinus infections: yes. Previous work up includes: skin testing in 2019 was negative but then broke out in rash and itching on her way home. No previous bloodwork.  Singulair caused nightmares and anxiety.  Previous ENT evaluation: yes had severe deviated septum s/p surgery on 08/05/2018 and doing much better. Last eye exam: June . History of nasal polyps: no.  Respiratory: She reports symptoms of chest tightness, shortness of breath, coughing for 1 year. Current medications include albuterol prn which help. She reports not using aerochamber with inhalers. She tried the following inhalers: Breo. Main triggers are dogs and being outdoors for prolonged time. In the last month, frequency of symptoms: <1x/week. Frequency of nocturnal symptoms: 0x/month. Frequency of SABA use: <1x/week. Interference with physical activity: no. Sleep is undisturbed. In the last 12 months,  emergency room visits/urgent care visits/doctor office visits or hospitalizations due to respiratory issues: 0. In the last 12 months, oral steroids courses: once. Lifetime history of hospitalization for respiratory issues: 0. Prior intubations: 0. History of pneumonia: yes in college. She was evaluated by allergist in the past. Smoking exposure: no. Up to date with flu vaccine: yes.  History of reflux: yes but not anymore.  Assessment and Plan: Roberta Gillespie is a 23 y.o. female with: Other allergic rhinitis Perennial rhinoconjunctivitis symptoms for the past 15+ years with worsening from spring through fall.  Used Zyrtec, azelastine nasal spray and Flonase with some benefit.  Skin testing in 2019 was negative but had some delayed reactions on her way home - in process of obtaining records.  Singulair caused nightmares and anxiety.  Status post sinus surgery 08/05/2018 and doing much better. Xyzal caused palpitations but tolerates zyrtec.   Get bloodwork to check for environmental allergies and will make additional recommendations based on results.  May use over the counter antihistamines such as Zyrtec (cetirizine), Claritin (loratadine), Allegra (fexofenadine), daily as needed.  Hold off any nasal sprays until ENT says okay.  Recommend to start Dymista 1 spray twice a day.   Nasal saline spray (i.e., Simply Saline) or nasal saline lavage (i.e., NeilMed) is recommended as needed and prior to medicated nasal sprays.  Reactive airway disease Noticed some chest tightness, shortness of breath and coughing for the past year.  Main triggers are exposure to dogs and being outdoors.  Used albuterol as needed less than once a week with some benefit  She was also prescribed Breo in the past but not sure if it helped.  Intolerant of  Singulair in the past. . Today's spirometry was normal. . She most likely has a component of reactive airway disease when around dogs and possibly outdoor pollens. Marland Kitchen. Keep track of  your inhaler use.  . If going to stay overnight at someone's house with a dog: o Recommend to restart Breo 1 puff once a day about 1 week before and continue throughout your stay and 1 week afterwards. Rinse out your mouth after each use.  . Daily controller medication(s): None . Prior to physical activity: May use albuterol rescue inhaler 2 puffs 5 to 15 minutes prior to strenuous physical activities. Marland Kitchen. Rescue medications: May use albuterol rescue inhaler 2 puffs or nebulizer every 4 to 6 hours as needed for shortness of breath, chest tightness, coughing, and wheezing. Monitor frequency of use.   Return in about 2 months (around 10/25/2018).  Lab Orders     Allergens w/Total IgE Area 2  Other allergy screening: Food allergy: no Medication allergy: yes  Amoxicillin - itching Latex - itching lorabid - hives xyzal- palpitations but okay with zyrtec Benzonatate - anaphylaxis with throat closure.  Hymenoptera allergy: no Urticaria: no Eczema:yes as a child History of recurrent infections suggestive of immunodeficency: no  Diagnostics: Spirometry:  Tracings reviewed. Her effort: Good reproducible efforts. FVC: 4.07L FEV1: 3.99L, 130% predicted FEV1/FVC ratio: 98% Interpretation: Spirometry consistent with normal pattern.  Please see scanned spirometry results for details.  Skin Testing: will get bloodwork, patient had delayed reaction in the past.  Past Medical History: Patient Active Problem List   Diagnosis Date Noted  . Other allergic rhinitis 08/24/2018  . Reactive airway disease 08/24/2018   Past Medical History:  Diagnosis Date  . Benign paroxysmal positional vertigo due to bilateral vestibular disorder   . Connective tissue disease (HCC)   . PONV (postoperative nausea and vomiting)    Past Surgical History: Past Surgical History:  Procedure Laterality Date  . ELBOW SURGERY    . FOOT SURGERY    . IRRIGATION AND DEBRIDEMENT SEBACEOUS CYST    . NASAL SEPTOPLASTY W/  TURBINOPLASTY Bilateral 08/05/2018   Procedure: NASAL SEPTOPLASTY WITH BILATERAL TURBINATE REDUCTION;  Surgeon: Newman Pieseoh, Su, MD;  Location: [facility];  Service: ENT;  Laterality: Bilateral;  . WISDOM TOOTH EXTRACTION     Medication List:  Current Outpatient Medications  Medication Sig Dispense Refill  . ALBUTEROL SULFATE HFA IN Inhale into the lungs.    . cetirizine (ZYRTEC) 10 MG tablet Take 10 mg by mouth daily.    . fluticasone furoate-vilanterol (BREO ELLIPTA) 100-25 MCG/INH AEPB Inhale 1 puff into the lungs daily.    . hydroxychloroquine (PLAQUENIL) 200 MG tablet Take by mouth daily.    Marland Kitchen. levonorgestrel (MIRENA) 20 MCG/24HR IUD 1 each by Intrauterine route once.    . Multiple Vitamins-Minerals (MULTIVITAMIN WITH MINERALS) tablet Take 1 tablet by mouth daily.    . Probiotic Product (PROBIOTIC PO) Take 1 tablet by mouth daily.     No current facility-administered medications for this visit.    Allergies: Allergies  Allergen Reactions  . Benzonatate Anaphylaxis  . Amoxil [Amoxicillin] Itching  . Latex   . Lorabid [Loracarbef] Hives  . Other     cinnamon  . Xyzal [Levocetirizine] Palpitations   Social History: Social History   Socioeconomic History  . Marital status: Single    Spouse name: Not on file  . Number of children: Not on file  . Years of education: Not on file  . Highest education level: Not on  file  Occupational History  . Not on file  Social Needs  . Financial resource strain: Not on file  . Food insecurity    Worry: Not on file    Inability: Not on file  . Transportation needs    Medical: Not on file    Non-medical: Not on file  Tobacco Use  . Smoking status: Never Smoker  . Smokeless tobacco: Never Used  Substance and Sexual Activity  . Alcohol use: Yes    Comment: ONCE A WEEK   . Drug use: No  . Sexual activity: Yes    Partners: Male    Birth control/protection: I.U.D., Condom    Comment: 1ST intercourse- 45, partners- 2, current  partner- 6 months   Lifestyle  . Physical activity    Days per week: Not on file    Minutes per session: Not on file  . Stress: Not on file  Relationships  . Social Herbalist on phone: Not on file    Gets together: Not on file    Attends religious service: Not on file    Active member of club or organization: Not on file    Attends meetings of clubs or organizations: Not on file    Relationship status: Not on file  Other Topics Concern  . Not on file  Social History Narrative  . Not on file   Lives in an apartment. Smoking: denies Occupation: teaches high school Spanish.   Environmental History: Water Damage/mildew in the house: yes on the ceiling. Carpet in the family room: no Carpet in the bedroom: yes Heating: electric Cooling: central Pet: no  Family History: Family History  Problem Relation Age of Onset  . Hypertension Maternal Grandmother   . Heart attack Maternal Grandmother   . Diabetes Maternal Grandfather   . Hypertension Paternal Grandfather   . Heart attack Paternal Grandfather   . Asthma Mother        hyperactive airway   . Hypothyroidism Mother   . Allergies Father    Review of Systems  Constitutional: Negative for appetite change, chills, fever and unexpected weight change.  HENT: Positive for congestion and rhinorrhea.   Eyes: Positive for itching.  Respiratory: Negative for cough, chest tightness, shortness of breath and wheezing.   Cardiovascular: Negative for chest pain.  Gastrointestinal: Negative for abdominal pain.  Genitourinary: Negative for difficulty urinating.  Skin: Negative for rash.  Allergic/Immunologic: Negative for food allergies.  Neurological: Negative for headaches.   Objective: BP (!) 96/56   Pulse 68   Temp 98 F (36.7 C) (Temporal)   Resp 16   Ht 5\' 2"  (1.575 m)   Wt 119 lb (54 kg)   SpO2 98%   BMI 21.77 kg/m  Body mass index is 21.77 kg/m. Physical Exam  Constitutional: She is oriented to person,  place, and time. She appears well-developed and well-nourished.  HENT:  Head: Normocephalic and atraumatic.  Right Ear: External ear normal.  Left Ear: External ear normal.  Mouth/Throat: Oropharynx is clear and moist.  Dried mucus in left nares  Eyes: Conjunctivae and EOM are normal.  Neck: Neck supple.  Cardiovascular: Normal rate, regular rhythm and normal heart sounds. Exam reveals no gallop and no friction rub.  No murmur heard. Pulmonary/Chest: Effort normal and breath sounds normal. She has no wheezes. She has no rales.  Abdominal: Soft.  Neurological: She is alert and oriented to person, place, and time.  Skin: Skin is warm. No rash noted.  Psychiatric:  She has a normal mood and affect. Her behavior is normal.  Nursing note and vitals reviewed.  The plan was reviewed with the patient/family, and all questions/concerned were addressed.  It was my pleasure to see Roberta Gillespie today and participate in her care. Please feel free to contact me with any questions or concerns.  Sincerely,  Wyline MoodYoon Markevius Trombetta, DO Allergy & Immunology  [facility] [place] office: [phone] [facility] office: [phone] [place] office: [phone]

## 2018-08-24 NOTE — Addendum Note (Signed)
Addended by: Garnet Sierras on: 08/24/2018 02:27 PM   Modules accepted: Orders

## 2018-08-24 NOTE — Addendum Note (Signed)
Addended by: Valere Dross on: 08/24/2018 02:16 PM   Modules accepted: Orders

## 2018-08-24 NOTE — Assessment & Plan Note (Addendum)
Perennial rhinoconjunctivitis symptoms for the past 15+ years with worsening from spring through fall.  Used Zyrtec, azelastine nasal spray and Flonase with some benefit.  Skin testing in 2019 was negative but had some delayed reactions on her way home - in process of obtaining records.  Singulair caused nightmares and anxiety.  Status post sinus surgery 08/05/2018 and doing much better. Xyzal caused palpitations but tolerates zyrtec.   Get bloodwork to check for environmental allergies and will make additional recommendations based on results.  May use over the counter antihistamines such as Zyrtec (cetirizine), Claritin (loratadine), Allegra (fexofenadine), daily as needed.  Hold off any nasal sprays until ENT says okay.  Recommend to start Dymista 1 spray twice a day.   Nasal saline spray (i.e., Simply Saline) or nasal saline lavage (i.e., NeilMed) is recommended as needed and prior to medicated nasal sprays.

## 2018-08-24 NOTE — Patient Instructions (Addendum)
Get bloodwork to look for environmental allergies.  May use over the counter antihistamines such as Zyrtec (cetirizine), Claritin (loratadine), Allegra (fexofenadine), daily as needed.  Hold off any nasal sprays until ENT says okay.  Recommend to start Dymista 1 spray twice a day.   Nasal saline spray (i.e., Simply Saline) or nasal saline lavage (i.e., NeilMed) is recommended as needed and prior to medicated nasal sprays.  Today's breathing test looked: normal.  Breathing: . Keep track of your inhaler use.  . If you do end up staying at someone's house with a dog. o Recommend that you restart Breo 1 puff once a day about 1 week before and continue throughout your stay and 1 week afterwards. Rinse out your mouth after each use.  . Daily controller medication(s): None . Prior to physical activity: May use albuterol rescue inhaler 2 puffs 5 to 15 minutes prior to strenuous physical activities. Marland Kitchen Rescue medications: May use albuterol rescue inhaler 2 puffs or nebulizer every 4 to 6 hours as needed for shortness of breath, chest tightness, coughing, and wheezing. Monitor frequency of use.  . Asthma control goals:  o Full participation in all desired activities (may need albuterol before activity) o Albuterol use two times or less a week on average (not counting use with activity) o Cough interfering with sleep two times or less a month o Oral steroids no more than once a year o No hospitalizations  Follow up in 2 months or sooner if needed.   Pet Allergen Avoidance: . Contrary to popular opinion, there are no "hypoallergenic" breeds of dogs or cats. That is because people are not allergic to an animal's hair, but to an allergen found in the animal's saliva, dander (dead skin flakes) or urine. Pet allergy symptoms typically occur within minutes. For some people, symptoms can build up and become most severe 8 to 12 hours after contact with the animal. People with severe allergies can experience  reactions in public places if dander has been transported on the pet owners' clothing. Marland Kitchen Keeping an animal outdoors is only a partial solution, since homes with pets in the yard still have higher concentrations of animal allergens. . Before getting a pet, ask your allergist to determine if you are allergic to animals. If your pet is already considered part of your family, try to minimize contact and keep the pet out of the bedroom and other rooms where you spend a great deal of time. . As with dust mites, vacuum carpets often or replace carpet with a hardwood floor, tile or linoleum. . High-efficiency particulate air (HEPA) cleaners can reduce allergen levels over time. . While dander and saliva are the source of cat and dog allergens, urine is the source of allergens from rabbits, hamsters, mice and [place] pigs; so ask a non-allergic family member to clean the animal's cage. . If you have a pet allergy, talk to your allergist about the potential for allergy immunotherapy (allergy shots). This strategy can often provide long-term relief. Reducing Pollen Exposure . Pollen seasons: trees (spring), grass (summer) and ragweed/weeds (fall). Marland Kitchen Keep windows closed in your home and car to lower pollen exposure.  Susa Simmonds air conditioning in the bedroom and throughout the house if possible.  . Avoid going out in dry windy days - especially early morning. . Pollen counts are highest between 5 - 10 AM and on dry, hot and windy days.  . Save outside activities for late afternoon or after a heavy rain, when pollen levels  are lower.  . Avoid mowing of grass if you have grass pollen allergy. Marland Kitchen. Be aware that pollen can also be transported indoors on people and pets.  . Dry your clothes in an automatic dryer rather than hanging them outside where they might collect pollen.  . Rinse hair and eyes before bedtime.

## 2018-08-24 NOTE — Assessment & Plan Note (Addendum)
Noticed some chest tightness, shortness of breath and coughing for the past year.  Main triggers are exposure to dogs and being outdoors.  Used albuterol as needed less than once a week with some benefit  She was also prescribed Breo in the past but not sure if it helped.  Intolerant of Singulair in the past. . Today's spirometry was normal. . She most likely has a component of reactive airway disease when around dogs and possibly outdoor pollens. Marland Kitchen Keep track of your inhaler use.  . If going to stay overnight at someone's house with a dog: o Recommend to restart Breo 1 puff once a day about 1 week before and continue throughout your stay and 1 week afterwards. Rinse out your mouth after each use.  . Daily controller medication(s): None . Prior to physical activity: May use albuterol rescue inhaler 2 puffs 5 to 15 minutes prior to strenuous physical activities. Marland Kitchen Rescue medications: May use albuterol rescue inhaler 2 puffs or nebulizer every 4 to 6 hours as needed for shortness of breath, chest tightness, coughing, and wheezing. Monitor frequency of use.

## 2018-08-27 LAB — ALLERGENS W/TOTAL IGE AREA 2

## 2018-08-29 ENCOUNTER — Encounter: Payer: Self-pay | Admitting: Allergy

## 2018-08-31 ENCOUNTER — Ambulatory Visit (INDEPENDENT_AMBULATORY_CARE_PROVIDER_SITE_OTHER): Payer: BC Managed Care – PPO

## 2018-08-31 ENCOUNTER — Ambulatory Visit: Payer: BC Managed Care – PPO | Admitting: Obstetrics & Gynecology

## 2018-08-31 ENCOUNTER — Other Ambulatory Visit: Payer: Self-pay

## 2018-08-31 DIAGNOSIS — T8332XD Displacement of intrauterine contraceptive device, subsequent encounter: Secondary | ICD-10-CM

## 2018-08-31 DIAGNOSIS — T8332XA Displacement of intrauterine contraceptive device, initial encounter: Secondary | ICD-10-CM

## 2018-08-31 DIAGNOSIS — R300 Dysuria: Secondary | ICD-10-CM | POA: Diagnosis not present

## 2018-08-31 DIAGNOSIS — R102 Pelvic and perineal pain: Secondary | ICD-10-CM

## 2018-08-31 NOTE — Progress Notes (Signed)
    Roberta Gillespie 27-Feb-1995 [id number]        23 y.o.  G0 single  RP: Mirena IUD strings lost for Pelvic US  HPI: Mirena IUD x 11/2015.  IUD strings not visible on last annual gynecologic exam June 2020.  No pelvic pain.  No pain with intercourse.  Treated for cystitis recently, no urinary tract infection symptoms currently except for intermittent discomfort after passing urine.  No fever.   OB History  Gravida Para Term Preterm AB Living  0 0 0 0 0 0  SAB TAB Ectopic Multiple Live Births  0 0 0 0 0    Past medical history,surgical history, problem list, medications, allergies, family history and social history were all reviewed and documented in the EPIC chart.   Directed ROS with pertinent positives and negatives documented in the history of present illness/assessment and plan.  Exam:  There were no vitals filed for this visit. General appearance:  Normal  U/A:  Completely negative  Pelvic US today: T/V images.  Uterus anteverted homogenous measuring 8.04 x 5.02 x 3.86 cm.  Endometrial lining normal at 3.0 mm.  Intrauterine device seen in normal intrauterine position.  Right ovary normal.  Left ovary with a thick-walled corpus luteum cyst with positive color flow Doppler at the periphery measuring 2.3 x 1.5 x 2.0 cm.  No free fluid in the posterior cul-de-sac.   Assessment/Plan:  23 y.o. G0  1. Intrauterine contraceptive device threads lost, subsequent encounter IUD since November 2017 with strings not visible on annual gynecologic exam June 2020.  Pelvic ultrasound today showing the Mirena IUD in normal intrauterine position.  Pelvic ultrasound otherwise completely normal.  Patient reassured.  2. Dysuria Very mild discomfort after passing urine post treatment of cystitis.  Urine analysis completely negative today.  Patient reassured. - Urinalysis,Complete w/RFL Culture  Other orders - REFLEXIVE URINE CULTURE  Counseling on above issues and coordination of care more  than 50% for 15 minutes.  [name] MD, 12:25 PM 08/31/2018

## 2018-09-01 LAB — URINALYSIS, COMPLETE W/RFL CULTURE
Bacteria, UA: NONE SEEN /HPF
Bilirubin Urine: NEGATIVE
Glucose, UA: NEGATIVE
Hgb urine dipstick: NEGATIVE
Hyaline Cast: NONE SEEN /LPF
Ketones, ur: NEGATIVE
Leukocyte Esterase: NEGATIVE
Nitrites, Initial: NEGATIVE
Protein, ur: NEGATIVE
RBC / HPF: NONE SEEN /HPF (ref 0–2)
Specific Gravity, Urine: 1.002 (ref 1.001–1.03)
WBC, UA: NONE SEEN /HPF (ref 0–5)
pH: 6 (ref 5.0–8.0)

## 2018-09-01 LAB — NO CULTURE INDICATED

## 2018-09-05 ENCOUNTER — Encounter: Payer: Self-pay | Admitting: Obstetrics & Gynecology

## 2018-09-05 NOTE — Patient Instructions (Signed)
1. Intrauterine contraceptive device threads lost, subsequent encounter IUD since November 2017 with strings not visible on annual gynecologic exam June 2020.  Pelvic ultrasound today showing the Mirena IUD in normal intrauterine position.  Pelvic ultrasound otherwise completely normal.  Patient reassured.  2. Dysuria Very mild discomfort after passing urine post treatment of cystitis.  Urine analysis completely negative today.  Patient reassured. - Urinalysis,Complete w/RFL Culture  Other orders - REFLEXIVE URINE CULTURE  Thelia, it was a pleasure seeing you today!

## 2018-09-21 ENCOUNTER — Telehealth: Payer: Self-pay | Admitting: *Deleted

## 2018-09-21 MED ORDER — FLUCONAZOLE 150 MG PO TABS: 150.0000 mg | ORAL_TABLET | Freq: Every day | ORAL | 0 refills | Status: DC

## 2018-09-21 NOTE — Telephone Encounter (Signed)
Patient was treated for UTI at [facility] on 08/31/18, now has external itching, irritation with white discharge, no odor. Patient asked if Rx for diflucan tablet could be sent pharmacy?  Please advise

## 2018-09-21 NOTE — Telephone Encounter (Signed)
Patient informed, Rx sent.  

## 2018-09-21 NOTE — Telephone Encounter (Signed)
Agree with Fluconazole 150 mg 1 tab PO daily x 3. 

## 2018-09-26 ENCOUNTER — Telehealth: Payer: Self-pay | Admitting: *Deleted

## 2018-09-26 NOTE — Telephone Encounter (Signed)
Tinidazole 2 tab BID x 2 days #8.  Fluconazole 150 mg 1 tab daily x 3 days #3.

## 2018-09-26 NOTE — Telephone Encounter (Signed)
Patient called c/o vaginal itching and vaginally burning with slight foul odor,she did take the diflucan 150 mg x 3 days, she has not been sexually active recently. No urinary symptoms. Patient is a Pharmacist, hospital and working remotely asked if Rx could be sent in? Please advise

## 2018-09-27 MED ORDER — FLUCONAZOLE 150 MG PO TABS: 150.0000 mg | ORAL_TABLET | Freq: Every day | ORAL | 0 refills | Status: DC

## 2018-09-27 MED ORDER — TINIDAZOLE 500 MG PO TABS: ORAL_TABLET | ORAL | 0 refills | Status: DC

## 2018-09-27 NOTE — Telephone Encounter (Signed)
Left detailed message on cell, Rx sent. If no improvement after Rx needs OV

## 2018-10-18 ENCOUNTER — Other Ambulatory Visit: Payer: Self-pay | Admitting: Allergy

## 2018-10-26 ENCOUNTER — Ambulatory Visit: Payer: BC Managed Care – PPO | Admitting: Allergy

## 2018-10-27 ENCOUNTER — Ambulatory Visit (INDEPENDENT_AMBULATORY_CARE_PROVIDER_SITE_OTHER): Payer: BC Managed Care – PPO | Admitting: Neurology

## 2018-10-27 ENCOUNTER — Other Ambulatory Visit: Payer: Self-pay

## 2018-10-27 ENCOUNTER — Encounter: Payer: Self-pay | Admitting: Neurology

## 2018-10-27 VITALS — BP 97/55 | HR 67 | Temp 97.5°F | Ht 62.0 in | Wt 121.0 lb

## 2018-10-27 DIAGNOSIS — R42 Dizziness and giddiness: Secondary | ICD-10-CM | POA: Insufficient documentation

## 2018-10-27 DIAGNOSIS — H5500 Unspecified nystagmus: Secondary | ICD-10-CM | POA: Diagnosis not present

## 2018-10-27 DIAGNOSIS — R200 Anesthesia of skin: Secondary | ICD-10-CM | POA: Diagnosis not present

## 2018-10-27 DIAGNOSIS — M7918 Myalgia, other site: Secondary | ICD-10-CM | POA: Insufficient documentation

## 2018-10-27 DIAGNOSIS — A881 Epidemic vertigo: Secondary | ICD-10-CM | POA: Diagnosis not present

## 2018-10-27 NOTE — Patient Instructions (Addendum)
Cervicogenic dizziness is characterized by the presence of imbalance, unsteadiness, disorientation, neck pain, limited cervical range of motion (ROM), and may be accompanied by a headache [2, 3]. The cervical spine may be considered the cause of the dizziness when all other potential causes of dizziness are excluded. To be considered CGD, dizziness should be closely related to changes in cervical spine position or cervical joint movement [4]. Although the etiology remains unknown, many cases of CGD have been diagnosed post whiplash injury, or have been associated with inflammatory, degenerative, or mechanical dysfunctions of the cervical spine   Occipital Neuralgia  Occipital neuralgia is a type of headache that causes brief episodes of very bad pain in the back of your head. Pain from occipital neuralgia may spread (radiate) to other parts of your head. These headaches may be caused by irritation of the nerves that leave your spinal cord high up in your neck, just below the base of your skull (occipital nerves). Your occipital nerves transmit sensations from the back of your head, the top of your head, and the areas behind your ears. What are the causes? This condition can occur without any known cause (primary headache syndrome). In other cases, this condition is caused by pressure on or irritation of one of the two occipital nerves. Pressure and irritation may be due to:  Muscle spasm in the neck.  Neck injury.  Wear and tear of the vertebrae in the neck (osteoarthritis).  Disease of the disks that separate the vertebrae.  Swollen blood vessels that put pressure on the occipital nerves.  Infections.  Tumors.  Diabetes. What are the signs or symptoms? This condition causes brief burning, stabbing, electric, shocking, or shooting pain which can radiate to the top of the head. It can happen on one side or both sides of the head. It can also cause:  Pain behind the eye.  Pain triggered  by neck movement or hair brushing.  Scalp tenderness.  Aching in the back of the head between episodes of very bad pain.  Pain gets worse with exposure to bright lights. How is this diagnosed? There is no test that diagnoses this condition. Your health care provider may diagnose this condition based on a physical exam and your symptoms. Other tests may be done, such as:  Imaging studies of the brain and neck (cervical spine), such as an MRI or CT scan. These look for causes of pinched nerves.  Applying pressure to the nerves in the neck to try to re-create the pain.  Injection of numbing medicine into the occipital nerve areas to see if pain goes away (diagnostic nerve block). How is this treated? Treatment for this condition may begin with simple measures, such as:  Rest.  Massage.  Applying heat or cold on the area.  Over-the-counter pain relievers. If these measures do not work, you may need other treatments, including:  Medicines, such as: ? Prescription-strength anti-inflammatory medicines. ? Muscle relaxants. ? Anti-seizure medicines, which can relieve pain. ? Antidepressants, which can relieve pain. ? Injected medicines, such as medicines that numb the area (local anesthetic) and steroids.  Pulsed radiofrequency ablation. This is when wires are implanted to deliver electrical impulses that block pain signals from the occipital nerve.  Surgery to relieve nerve pressure.  Physical therapy. Follow these instructions at home: Pain management      Avoid any activities that cause pain.  Rest when you have an attack of pain.  Try gentle massage to relieve pain.  Try a different  pillow or sleeping position.  If directed, apply heat to the affected area as told by your health care provider. Use the heat source that your health care provider recommends, such as a moist heat pack or a heating pad. ? Place a towel between your skin and the heat source. ? Leave the  heat on for 20-30 minutes. ? Remove the heat if your skin turns bright red. This is especially important if you are unable to feel pain, heat, or cold. You may have a greater risk of getting burned.  If directed, apply ice to the back of the head and neck area as told by your health care provider. ? Put ice in a plastic bag. ? Place a towel between your skin and the bag. ? Leave the ice on for 20 minutes, 2-3 times per day. General instructions  Take over-the-counter and prescription medicines only as told by your health care provider.  Avoid things that make your symptoms worse, such as bright lights.  Try to stay active. Get regular exercise that does not cause pain. Ask your health care provider to suggest safe exercises for you.  Work with a physical therapist to learn stretching exercises you can do at home.  Practice good posture.  Keep all follow-up visits as told by your health care provider. This is important. Contact a health care provider if:  Your medicine is not working.  You have new or worsening symptoms. Get help right away if:  You have very bad head pain that does not go away.  You have a sudden change in vision, balance, or speech. Summary  Occipital neuralgia is a type of headache that causes brief episodes of very bad pain in the back of your head.  Pain from occipital neuralgia may spread (radiate) to other parts of your head.  Treatment for this condition includes rest, massage, and medicines. This information is not intended to replace advice given to you by your health care provider. Make sure you discuss any questions you have with your health care provider. Document Released: 01/06/2001 Document Revised: 12/29/2016 Document Reviewed: 03/19/2016 Elsevier Patient Education  2020 [employer].

## 2018-10-27 NOTE — Progress Notes (Signed)
[facility]    Provider:  Dr Lucia Gaskins Requesting Provider: Estill Bamberg MD Primary Care Provider:  Farris Has, MD  CC:  Headache and neck pain  HPI:  Roberta Gillespie is a 23 y.o. female here as requested by Estill Bamberg, MD for neck pain and dizziness. She has been having myofascial neck pain and has been working with physical therapy, she is feeling much better, she started working from home and has been flexing a neck a lot, positional nystagmus, she was sitting on the couch and she felt herself and felt the room around her spinning, when she drifts off to sleep she gets tingling in her cheeks and a headache and pain in her head and she has an electric pain, once every 6 weeks, brief can be painful and catches her off guard for split a second and then it goes away. She sleeps on her right side and may be positional. When she was in college she used to ave brief slapping feeling on the side of the brain. She also has right side face tingling. She has numbness in the hands. She has persistent vertigo every few weeks. Positional. Nothing makes it better and she went to see a vestibular doctor. She has been shown the epley maneuvers and she has been given exercising   Reviewed notes, labs and imaging from outside physicians, which showed:   I reviewed Roberta Gillespie's notes.  Patient was seen for pain in her neck, as well as headaches.  She was started with physical therapy and she is found that beneficial.  Her headaches have a reduced in her neck range of motion and tension have reduced substantially.  She continues with physical therapy.  I also provided her Robaxin and she is been finding good benefit with this.  She is taking it intermittently.  They did recommend an MRI of her cervical spine.  She does continue to have intermittent nystagmus.  Per notes, MRI dated November 05, 2022 is unremarkable, I reviewed report, there is no suggestion of disc herniation, spinal cord  compression or nerve compression.  Improving axial neck pain and headaches.  Predominantly myofascial in origin.  Intermittent nystagmus and dizziness unexplained from the cervical region.  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  Review of Systems: Patient complains of symptoms per HPI as well as the following symptoms vertigo, dizziness, neck pain, numbness and tingling. Pertinent negatives and positives per HPI. All others negative.   Social History   Socioeconomic History  . Marital status: Single    Spouse name: Not on file  . Number of children: Not on file  . Years of education: Not on file  . Highest education level: Bachelor's degree (e.g., BA, AB, BS)  Occupational History  . Not on file  Social Needs  . Financial resource strain: Not on file  . Food insecurity    Worry: Not on file    Inability: Not on file  . Transportation needs    Medical: Not on file    Non-medical: Not on file  Tobacco Use  . Smoking status: Never Smoker  . Smokeless tobacco: Never Used  Substance and Sexual Activity  . Alcohol use: Yes    Comment: ONCE A WEEK   . Drug use: No  . Sexual activity: Yes    Partners: Male    Birth control/protection: I.U.D., Condom    Comment: 1ST intercourse- 18, partners- 2, current partner- 6 months   Lifestyle  .  Physical activity    Days per week: Not on file    Minutes per session: Not on file  . Stress: Not on file  Relationships  . Social Herbalist on phone: Not on file    Gets together: Not on file    Attends religious service: Not on file    Active member of club or organization: Not on file    Attends meetings of clubs or organizations: Not on file    Relationship status: Not on file  . Intimate partner violence    Fear of current or ex partner: Not on file    Emotionally abused: Not on file    Physically abused: Not on file    Forced sexual activity: Not on file  Other Topics Concern  .  Not on file  Social History Narrative   Lives with roommate    Right handed   Caffeine: minimal    Family History  Problem Relation Age of Onset  . Hypertension Maternal Grandmother   . Heart attack Maternal Grandmother   . Stroke Maternal Grandmother   . Diabetes Maternal Grandfather   . Hypertension Paternal Grandfather   . Heart attack Paternal Grandfather   . Stroke Paternal Grandfather   . Asthma Mother        hyperactive airway   . Hypothyroidism Mother   . Allergies Father   . Migraines Paternal Aunt   . Migraines Paternal Aunt   . Migraines Paternal Aunt     Past Medical History:  Diagnosis Date  . Benign paroxysmal positional vertigo due to bilateral vestibular disorder   . Connective tissue disease ([place])   . History of reactive airway disease   . Plantar fasciitis   . PONV (postoperative nausea and vomiting)     Patient Active Problem List   Diagnosis Date Noted  . Myofascial pain syndrome, cervical 10/27/2018  . Vertigo 10/27/2018  . Other allergic rhinitis 08/24/2018  . Reactive airway disease 08/24/2018    Past Surgical History:  Procedure Laterality Date  . ELBOW SURGERY    . FOOT SURGERY     bunionectomy  . IRRIGATION AND DEBRIDEMENT SEBACEOUS CYST    . mole resection    . NASAL SEPTOPLASTY W/ TURBINOPLASTY Bilateral 08/05/2018   Procedure: NASAL SEPTOPLASTY WITH BILATERAL TURBINATE REDUCTION;  Surgeon: Leta Baptist, MD;  Location: [facility];  Service: ENT;  Laterality: Bilateral;  . WISDOM TOOTH EXTRACTION      Current Outpatient Medications  Medication Sig Dispense Refill  . Acetaminophen (TYLENOL PO) Take by mouth as needed.    Marland Kitchen albuterol (VENTOLIN HFA) 108 (90 Base) MCG/ACT inhaler Inhale 2 puffs into the lungs every 4 (four) hours as needed for wheezing or shortness of breath (coughing). 18 g 1  . Azelastine-Fluticasone (DYMISTA) 137-50 MCG/ACT SUSP Place 1 spray into both nostrils 2 (two) times a day. 23 g 5  . BREO ELLIPTA  100-25 MCG/INH AEPB TAKE 1 PUFF BY MOUTH EVERY DAY 60 each 0  . fluconazole (DIFLUCAN) 150 MG tablet Take 1 tablet (150 mg total) by mouth daily. (Patient taking differently: Take 150 mg by mouth daily. Takes as needed) 3 tablet 0  . hydroxychloroquine (PLAQUENIL) 200 MG tablet Take by mouth daily.    . IBUPROFEN PO Take by mouth as needed.    Marland Kitchen levonorgestrel (MIRENA) 20 MCG/24HR IUD 1 each by Intrauterine route once.    . Multiple Vitamins-Minerals (MULTIVITAMIN WITH MINERALS) tablet Take 1 tablet by mouth daily.    Marland Kitchen  Probiotic Product (PROBIOTIC PO) Take 1 tablet by mouth daily.    . cetirizine (ZYRTEC) 10 MG tablet Take 10 mg by mouth daily as needed.     . tinidazole (TINDAMAX) 500 MG tablet Take 2 tablets by mouth twice daily for 2 days (Patient not taking: Reported on 10/27/2018) 8 tablet 0   No current facility-administered medications for this visit.     Allergies as of 10/27/2018 - Review Complete 10/27/2018  Allergen Reaction Noted  . Benzonatate Anaphylaxis 07/28/2018  . Amoxil [amoxicillin] Itching 08/17/2018  . Latex Itching and Rash 02/15/2017  . Lorabid [loracarbef] Hives 02/15/2017  . Other  02/15/2017  . Xyzal [levocetirizine] Palpitations 07/28/2018    Vitals: BP (!) 97/55 (BP Location: Right Arm, Patient Position: Sitting)   Pulse 67   Temp (!) 97.5 F (36.4 C) Comment: taken by check-in staff  Ht 5\' 2"  (1.575 m)   Wt 121 lb (54.9 kg)   BMI 22.13 kg/m  Last Weight:  Wt Readings from Last 1 Encounters:  10/27/18 121 lb (54.9 kg)   Last Height:   Ht Readings from Last 1 Encounters:  10/27/18 5\' 2"  (1.575 m)     Physical exam: Exam: Gen: NAD, conversant, well nourised, well groomed                     CV: RRR, no MRG. No Carotid Bruits. No peripheral edema, warm, nontender Eyes: Conjunctivae clear without exudates or hemorrhage  Neuro: Detailed Neurologic Exam  Speech:    Speech is normal; fluent and spontaneous with normal comprehension.   Cognition:    The patient is oriented to person, place, and time;     recent and remote memory intact;     language fluent;     normal attention, concentration,     fund of knowledge Cranial Nerves:    The pupils are equal, round, and reactive to light. The fundi are normal and spontaneous venous pulsations are present. Visual fields are full to finger confrontation. Extraocular movements are intact, nystagmus bilaterally fast beating to her left bilat. Trigeminal sensation is intact and the muscles of mastication are normal. The face is symmetric. The palate elevates in the midline. Hearing intact. Voice is normal. Shoulder shrug is normal. The tongue has normal motion without fasciculations.   Coordination:    Normal finger to nose and heel to shin. Normal rapid alternating movements.   Gait:    Heel-toe and tandem gait are normal.   Motor Observation:    No asymmetry, no atrophy, and no involuntary movements noted. Tone:    Normal muscle tone.    Posture:    Posture is normal. normal erect    Strength:    Strength is V/V in the upper and lower limbs.      Sensation: intact to LT     Reflex Exam:  DTR's:    Deep tendon reflexes in the upper and lower extremities are normal bilaterally.   Toes:    The toes are downgoing bilaterally.   Clonus:    Clonus is absent.    Assessment/Plan:  23 year old with significant cervical myofascial neck pain and vertigo, nystagmus and some brief shooting sharp headaches. Cervicogenic dizziness and/or occipital nerve irritation. But given age, symptoms, physical findings need to evaluate for schwannoma, space occupying lesions, multiple sclerosis.  Cervicogenic dizziness is characterized by the presence of imbalance, unsteadiness, disorientation, neck pain, limited cervical range of motion (ROM), and may be accompanied by a headache [  2, 3]. The cervical spine may be considered the cause of the dizziness when all other potential causes of  dizziness are excluded. To be considered CGD, dizziness should be closely related to changes in cervical spine position or cervical joint movement [4]. Although the etiology remains unknown, many cases of CGD have been diagnosed post whiplash injury, or have been associated with inflammatory, degenerative, or mechanical dysfunctions of the cervical spine  Orders Placed This Encounter  Procedures  . MR BRAIN W WO CONTRAST  . TSH    Cc: Farris Has, MD,  Estill Bamberg, MD  Naomie Dean, MD  [facility] [address] [place], [place] [address]  Phone [phone] Fax [phone]

## 2018-10-28 LAB — TSH: TSH: 5.23 u[IU]/mL — ABNORMAL HIGH (ref 0.450–4.500)

## 2018-10-31 ENCOUNTER — Telehealth: Payer: Self-pay | Admitting: Neurology

## 2018-10-31 NOTE — Telephone Encounter (Signed)
Patient returned my call she is scheduled at [facility] for 11/02/18. No to the covid questions.

## 2018-10-31 NOTE — Telephone Encounter (Signed)
LVM for pt to call back about scheduling MRI  [employer] Auth: [id number] (exp. 10/27/18 to 04/24/19)

## 2018-11-02 ENCOUNTER — Ambulatory Visit (INDEPENDENT_AMBULATORY_CARE_PROVIDER_SITE_OTHER): Payer: BC Managed Care – PPO

## 2018-11-02 ENCOUNTER — Other Ambulatory Visit: Payer: Self-pay

## 2018-11-02 DIAGNOSIS — R42 Dizziness and giddiness: Secondary | ICD-10-CM

## 2018-11-02 DIAGNOSIS — R200 Anesthesia of skin: Secondary | ICD-10-CM

## 2018-11-02 DIAGNOSIS — H5500 Unspecified nystagmus: Secondary | ICD-10-CM

## 2018-11-02 DIAGNOSIS — A881 Epidemic vertigo: Secondary | ICD-10-CM

## 2018-11-02 MED ORDER — GADOBENATE DIMEGLUMINE 529 MG/ML IV SOLN
10.0000 mL | Freq: Once | INTRAVENOUS | Status: AC | PRN
  Administered 2018-11-02: 10 mL via INTRAVENOUS

## 2018-11-04 ENCOUNTER — Encounter: Payer: Self-pay | Admitting: Allergy

## 2018-11-04 ENCOUNTER — Other Ambulatory Visit: Payer: Self-pay

## 2018-11-04 ENCOUNTER — Ambulatory Visit (INDEPENDENT_AMBULATORY_CARE_PROVIDER_SITE_OTHER): Payer: BC Managed Care – PPO | Admitting: Allergy

## 2018-11-04 VITALS — BP 96/60 | HR 70 | Temp 98.2°F | Resp 16

## 2018-11-04 DIAGNOSIS — J4520 Mild intermittent asthma, uncomplicated: Secondary | ICD-10-CM | POA: Diagnosis not present

## 2018-11-04 DIAGNOSIS — J310 Chronic rhinitis: Secondary | ICD-10-CM | POA: Diagnosis not present

## 2018-11-04 MED ORDER — LEVALBUTEROL TARTRATE 45 MCG/ACT IN AERO: 2.0000 | INHALATION_SPRAY | Freq: Four times a day (QID) | RESPIRATORY_TRACT | 1 refills | Status: DC | PRN

## 2018-11-04 NOTE — Assessment & Plan Note (Signed)
Past history - Perennial rhinoconjunctivitis symptoms for the past 15+ years with worsening from spring through fall.  Used Zyrtec, azelastine nasal spray and Flonase with some benefit.  Skin testing in 2019 was negative but had some delayed reactions on her way home - in process of obtaining records.  Singulair caused nightmares and anxiety.  Status post sinus surgery 08/05/2018 and doing much better. Xyzal caused palpitations but tolerates zyrtec.  Interim history - 2020 bloodwork was negative to environmental allergies and IgE level was 9. Only using zyrtec and dymista as needed especially when outdoors for a long time.  We will do skin testing at the next visit to check for the dog allergy and other allergens as she is interested in getting a dog in the future.   May use over the counter antihistamines such as Zyrtec (cetirizine), Claritin (loratadine), Allegra (fexofenadine), daily as needed.  May use Dymista 1 spray twice a day as needed.

## 2018-11-04 NOTE — Progress Notes (Signed)
Follow Up Note  RE: Roberta Gillespie: [id number] DOB: 05/24/1995 Date of Office Visit: 11/04/2018  Referring provider: Farris HasMorrow, Aaron, MD Primary care provider: Farris HasMorrow, Aaron, MD  Chief Complaint: Allergic Rhinitis   History of Present Illness: I had the pleasure of seeing Roberta Gillespie for a follow up visit at the [facility] on 11/04/2018. She is a 23 y.o. female, who is being followed for rhinitis and reactive airway disease. Today she is here for regular follow up visit.  Her previous allergy office visit was on 08/24/2018 with Dr. Selena BattenKim.   Rhinitis  Doing much better after the sinus surgery.  Sometimes has some itchy nose and uses zyrtec prn with good benefit. Used dymista initially but now only using as needed with good benefit.   Reactive airway disease Some chest tightness and itchy nose when spending more time outdoors. Uses albuterol 2-3 times a month with good benefit but sometimes it causes palpitations.   Patient used Breo 1 week before and during while she was at a friend's house with a dog. She still had some chest tightness and used albuterol twice that week with good benefit.   Assessment and Plan: Roberta Gillespie is a 23 y.o. female with: Reactive airway disease Past history - Noticed some chest tightness, shortness of breath and coughing for the past year.  Main triggers are exposure to dogs and being outdoors.  Used albuterol as needed less than once a week with some benefit  She was also prescribed Breo in the past but not sure if it helped.  Intolerant of Singulair in the past. Interim history - Using albuterol 2-3 times a month with good benefit. [place] Breo [place] [employer] and not sure if it helped as she had to use her rescue twice. Albuterol causes palpitations.  . Today's spirometry was normal. . Will send in xopenex which should not give her the palpitation side effect that albuterol does.   If going to stay overnight at  someone's house with a dog: ? Recommend to restart Breo 100 1 puff once a day about 1 week before and continue throughout your stay and 1 week afterwards. Rinse out your mouth after each use.  ? Demonstrated proper use.  ? Also start taking an antihistamine such as zyrtec 10mg  daily while around the dog and see if it helps.   Daily controller medication(s):None  Prior to physical activity:May use levoalbuterol (xopenex) rescue inhaler 2 puffs 5 to 15 minutes prior to strenuous physical activities.  Rescue medications:May use levoalbuterol rescue inhaler 2 puffs every 4 to 6 hours as needed for shortness of breath, chest tightness, coughing, and wheezing. Monitor frequency of use.   Nonallergic rhinitis Past history - Perennial rhinoconjunctivitis symptoms for the past 15+ years with worsening from spring through fall.  Used Zyrtec, azelastine nasal spray and Flonase with some benefit.  Skin testing in 2019 was negative but had some delayed reactions on her way home - in process of obtaining records.  Singulair caused nightmares and anxiety.  Status post sinus surgery 08/05/2018 and doing much better. Xyzal caused palpitations but tolerates zyrtec.  Interim history - 2020 bloodwork was negative to environmental allergies and IgE level was 9. Only using zyrtec and dymista as needed especially when outdoors for a long time.  We will do skin testing at the next visit to check for the dog allergy and other allergens as she is interested in getting a dog in the future.  May use over the counter antihistamines such as Zyrtec (cetirizine), Claritin (loratadine), Allegra (fexofenadine), daily as needed.  May use Dymista 1 spray twice a day as needed.   Return in about 6 months (around 05/05/2019) for Skin testing.  Meds ordered this encounter  Medications  . levalbuterol (XOPENEX HFA) 45 MCG/ACT inhaler    Sig: Inhale 2 puffs into the lungs every 6 (six) hours as needed for wheezing or shortness of  breath.    Dispense:  1 Inhaler    Refill:  1   Diagnostics: Spirometry:  Tracings reviewed. Her effort: Good reproducible efforts. FVC: 3.81L FEV1: 3.43L, 112% predicted FEV1/FVC ratio: 90% Interpretation: Spirometry consistent with normal pattern.  Please see scanned spirometry results for details.  Medication List:  Current Outpatient Medications  Medication Sig Dispense Refill  . Acetaminophen (TYLENOL PO) Take by mouth as needed.    . Adapalene 0.3 % gel     . albuterol (VENTOLIN HFA) 108 (90 Base) MCG/ACT inhaler Inhale 2 puffs into the lungs every 4 (four) hours as needed for wheezing or shortness of breath (coughing). 18 g 1  . Azelastine-Fluticasone (DYMISTA) 137-50 MCG/ACT SUSP Place 1 spray into both nostrils 2 (two) times a day. 23 g 5  . BREO ELLIPTA 100-25 MCG/INH AEPB TAKE 1 PUFF BY MOUTH EVERY DAY 60 each 0  . cetirizine (ZYRTEC) 10 MG tablet Take 10 mg by mouth daily as needed.     . hydroxychloroquine (PLAQUENIL) 200 MG tablet Take 200 mg by mouth daily. Started today to take 1 1/2 tablet once daily for a total of 300 mg daily.    . IBUPROFEN PO Take by mouth as needed.    Marland Kitchen levonorgestrel (MIRENA) 20 MCG/24HR IUD 1 each by Intrauterine route once.    . metroNIDAZOLE (METROGEL) 1 % gel     . Multiple Vitamins-Minerals (MULTIVITAMIN WITH MINERALS) tablet Take 1 tablet by mouth daily.    . Probiotic Product (PROBIOTIC PO) Take 1 tablet by mouth daily.    . Sulfacetamide Sodium-Sulfur 8-4 % SUSP     . fluconazole (DIFLUCAN) 150 MG tablet Take 1 tablet (150 mg total) by mouth daily. (Patient not taking: Reported on 11/04/2018) 3 tablet 0  . levalbuterol (XOPENEX HFA) 45 MCG/ACT inhaler Inhale 2 puffs into the lungs every 6 (six) hours as needed for wheezing or shortness of breath. 1 Inhaler 1   No current facility-administered medications for this visit.    Allergies: Allergies  Allergen Reactions  . Benzonatate Anaphylaxis  . Amoxil [Amoxicillin] Itching  . Latex  Itching and Rash  . Lorabid [Loracarbef] Hives  . Other     cinnamon  . Xyzal [Levocetirizine] Palpitations   I reviewed her past medical history, social history, family history, and environmental history and no significant changes have been reported from her previous visit.  Review of Systems  Constitutional: Negative for appetite change, chills, fever and unexpected weight change.  HENT: Negative for congestion and rhinorrhea.   Eyes: Negative for itching.  Respiratory: Negative for cough, chest tightness, shortness of breath and wheezing.   Cardiovascular: Negative for chest pain.  Gastrointestinal: Negative for abdominal pain.  Genitourinary: Negative for difficulty urinating.  Skin: Negative for rash.  Allergic/Immunologic: Negative for environmental allergies and food allergies.  Neurological: Negative for headaches.   Objective: BP 96/60 (BP Location: Right Arm, Patient Position: Sitting, Cuff Size: Normal)   Pulse 70   Temp 98.2 F (36.8 C) (Oral)   Resp 16   SpO2 100%  There is no height or weight on file to calculate BMI. Physical Exam  Constitutional: She is oriented to person, place, and time. She appears well-developed and well-nourished.  HENT:  Head: Normocephalic and atraumatic.  Right Ear: External ear normal.  Left Ear: External ear normal.  Mouth/Throat: Oropharynx is clear and moist.  Eyes: Conjunctivae and EOM are normal.  Neck: Neck supple.  Cardiovascular: Normal rate, regular rhythm and normal heart sounds. Exam reveals no gallop and no friction rub.  No murmur heard. Pulmonary/Chest: Effort normal and breath sounds normal. She has no wheezes. She has no rales.  Abdominal: Soft.  Neurological: She is alert and oriented to person, place, and time.  Skin: Skin is warm. No rash noted.  Psychiatric: She has a normal mood and affect. Her behavior is normal.  Nursing note and vitals reviewed.  Previous notes and tests were reviewed. The plan was  reviewed with the patient/family, and all questions/concerned were addressed.  It was my pleasure to see Tamiko today and participate in her care. Please feel free to contact me with any questions or concerns.  Sincerely,  Wyline Mood, DO Allergy & Immunology  [facility] office: [phone] [facility] office: [phone] [place] office: [phone]

## 2018-11-04 NOTE — Patient Instructions (Addendum)
Chronic rhinitis  We will do skin testing at the next visit to check for the dog allergy.  Don't take antihistamines for 3 days before.   May use over the counter antihistamines such as Zyrtec (cetirizine), Claritin (loratadine), Allegra (fexofenadine), daily as needed.  May use Dymista 1 spray twice a day as needed.   Reactive airway disease  Today's spirometry was normal.  If going to stay overnight at someone's house with a dog: ? Recommend to restart Breo 100 1 puff once a day about 1 week before and continue throughout your stay and 1 week afterwards. Rinse out your mouth after each use.  ? Also start taking an antihistamine such as zyrtec 10mg  daily while around the dog and see if it helps.   Daily controller medication(s):None  Prior to physical activity:May use levoalbuterol (xopenex) rescue inhaler 2 puffs 5 to 15 minutes prior to strenuous physical activities.  Rescue medications:May use levoalbuterol rescue inhaler 2 puffs every 4 to 6 hours as needed for shortness of breath, chest tightness, coughing, and wheezing. Monitor frequency of use.  Breathing control goals:  Full participation in all desired activities (may need albuterol before activity) Albuterol use two times or less a week on average (not counting use with activity) Cough interfering with sleep two times or less a month Oral steroids no more than once a year No hospitalizations  Follow up in 6 months for skin testing and regular follow up visit.

## 2018-11-04 NOTE — Assessment & Plan Note (Signed)
Past history - Noticed some chest tightness, shortness of breath and coughing for the past year.  Main triggers are exposure to dogs and being outdoors.  Used albuterol as needed less than once a week with some benefit  She was also prescribed Breo in the past but not sure if it helped.  Intolerant of Singulair in the past. Interim history - Using albuterol 2-3 times a month with good benefit. Tried using Breo when at [employer] with dog and not sure if it helped as she had to use her rescue twice. Albuterol causes palpitations.  . Today's spirometry was normal. . Will send in xopenex which should not give her the palpitation side effect that albuterol does.   If going to stay overnight at someone's house with a dog: ? Recommend to restart Breo 100 1 puff once a day about 1 week before and continue throughout your stay and 1 week afterwards. Rinse out your mouth after each use.  ? Demonstrated proper use.  ? Also start taking an antihistamine such as zyrtec 10mg  daily while around the dog and see if it helps.   Daily controller medication(s):None  Prior to physical activity:May use levoalbuterol (xopenex) rescue inhaler 2 puffs 5 to 15 minutes prior to strenuous physical activities.  Rescue medications:May use levoalbuterol rescue inhaler 2 puffs every 4 to 6 hours as needed for shortness of breath, chest tightness, coughing, and wheezing. Monitor frequency of use.

## 2018-11-08 ENCOUNTER — Other Ambulatory Visit: Payer: Self-pay | Admitting: Neurology

## 2018-11-08 MED ORDER — CYCLOBENZAPRINE HCL 10 MG PO TABS: 10.0000 mg | ORAL_TABLET | Freq: Every day | ORAL | 3 refills | Status: DC

## 2018-11-13 ENCOUNTER — Other Ambulatory Visit: Payer: Self-pay | Admitting: Neurology

## 2018-11-13 DIAGNOSIS — M7918 Myalgia, other site: Secondary | ICD-10-CM

## 2018-11-15 ENCOUNTER — Telehealth: Payer: Self-pay | Admitting: Neurology

## 2018-11-15 NOTE — Telephone Encounter (Signed)
Called patient and left her a message about where she wanted to go for  her physical therapy.

## 2018-11-20 ENCOUNTER — Other Ambulatory Visit: Payer: Self-pay | Admitting: Allergy

## 2018-12-07 ENCOUNTER — Other Ambulatory Visit: Payer: Self-pay

## 2018-12-07 ENCOUNTER — Ambulatory Visit (INDEPENDENT_AMBULATORY_CARE_PROVIDER_SITE_OTHER): Payer: BC Managed Care – PPO | Admitting: Obstetrics & Gynecology

## 2018-12-07 ENCOUNTER — Encounter: Payer: Self-pay | Admitting: Obstetrics & Gynecology

## 2018-12-07 VITALS — BP 122/78

## 2018-12-07 DIAGNOSIS — R300 Dysuria: Secondary | ICD-10-CM

## 2018-12-07 DIAGNOSIS — N898 Other specified noninflammatory disorders of vagina: Secondary | ICD-10-CM | POA: Diagnosis not present

## 2018-12-07 DIAGNOSIS — Z113 Encounter for screening for infections with a predominantly sexual mode of transmission: Secondary | ICD-10-CM

## 2018-12-07 LAB — URINALYSIS, COMPLETE W/RFL CULTURE
Bacteria, UA: NONE SEEN /HPF
Bilirubin Urine: NEGATIVE
Glucose, UA: NEGATIVE
Hgb urine dipstick: NEGATIVE
Hyaline Cast: NONE SEEN /LPF
Ketones, ur: NEGATIVE
Leukocyte Esterase: NEGATIVE
Nitrites, Initial: NEGATIVE
Protein, ur: NEGATIVE
RBC / HPF: NONE SEEN /HPF (ref 0–2)
Specific Gravity, Urine: 1.01 (ref 1.001–1.03)
WBC, UA: NONE SEEN /HPF (ref 0–5)
pH: 5.5 (ref 5.0–8.0)

## 2018-12-07 LAB — NO CULTURE INDICATED

## 2018-12-07 LAB — WET PREP FOR TRICH, YEAST, CLUE

## 2018-12-07 MED ORDER — FLUCONAZOLE 150 MG PO TABS: 150.0000 mg | ORAL_TABLET | Freq: Every day | ORAL | 3 refills | Status: AC

## 2018-12-07 NOTE — Progress Notes (Signed)
    Roberta Gillespie 09-Jul-1995 [id number]        23 y.o.  G0 Single  RP: Dysuria and vaginal discharge with itching  HPI: C/O vaginal itching with increased d/c.  Pain with urination.  On Nitrofurantoin for prophylaxis of bladder infection.  Well on Mirena IUD x 11/2015.  IUD confirmed in good IU position 08/2018.     OB History  Gravida Para Term Preterm AB Living  0 0 0 0 0 0  SAB TAB Ectopic Multiple Live Births  0 0 0 0 0    Past medical history,surgical history, problem list, medications, allergies, family history and social history were all reviewed and documented in the EPIC chart.   Directed ROS with pertinent positives and negatives documented in the history of present illness/assessment and plan.  Exam:  Vitals:   12/07/18 1357  BP: 122/78   General appearance:  Normal  CVAT negative bilaterally  Abdomen: Normal  Gynecologic exam: Vulva normal. Speculum:  Cervix/Vagina normal.  Increased vaginal d/c.  Wet prep done.  U/A completely negative Wet prep: Yeast present   Assessment/Plan:  23 y.o. G0  1. Dysuria Nitrofurantoin Prophylaxis with IC.  U/A negative. - Urinalysis,Complete w/RFL Culture  2. Vaginal itching Yeast vaginitis confirmed by wet prep.  Will treat with fluconazole 1 tablet daily for 3 days.  Boric acid over-the-counter suggested.  Probiotic tablet vaginally as needed for prophylaxis.  Usage of fluconazole discussed and prescription sent to pharmacy. - WET PREP FOR Roberta Gillespie, YEAST, CLUE  Other orders - fluconazole (DIFLUCAN) 150 MG tablet; Take 1 tablet (150 mg total) by mouth daily for 3 days.  Counseling on above issues and coordination of care more than 50% for 15 minutes.  [name] MD, 2:17 PM 12/07/2018

## 2018-12-08 LAB — C. TRACHOMATIS/N. GONORRHOEAE RNA
C. trachomatis RNA, TMA: NOT DETECTED
N. gonorrhoeae RNA, TMA: NOT DETECTED

## 2018-12-09 ENCOUNTER — Encounter: Payer: Self-pay | Admitting: *Deleted

## 2018-12-12 ENCOUNTER — Encounter: Payer: Self-pay | Admitting: Obstetrics & Gynecology

## 2018-12-12 NOTE — Patient Instructions (Signed)
1. Dysuria Nitrofurantoin Prophylaxis with IC.  U/A negative. - Urinalysis,Complete w/RFL Culture  2. Vaginal itching Yeast vaginitis confirmed by wet prep.  Will treat with fluconazole 1 tablet daily for 3 days.  Boric acid over-the-counter suggested.  Probiotic tablet vaginally as needed for prophylaxis.  Usage of fluconazole discussed and prescription sent to pharmacy. - WET PREP FOR [place], YEAST, CLUE  Other orders - fluconazole (DIFLUCAN) 150 MG tablet; Take 1 tablet (150 mg total) by mouth daily for 3 days.  Roberta Gillespie, it was a pleasure seeing you today!

## 2018-12-21 ENCOUNTER — Other Ambulatory Visit: Payer: Self-pay

## 2018-12-21 DIAGNOSIS — Z20822 Contact with and (suspected) exposure to covid-19: Secondary | ICD-10-CM

## 2018-12-22 LAB — NOVEL CORONAVIRUS, NAA: SARS-CoV-2, NAA: NOT DETECTED

## 2019-01-06 MED ORDER — TERCONAZOLE 0.8 % VA CREA: 1.0000 | TOPICAL_CREAM | Freq: Every day | VAGINAL | 2 refills | Status: DC

## 2019-01-19 ENCOUNTER — Other Ambulatory Visit: Payer: BC Managed Care – PPO

## 2019-02-15 ENCOUNTER — Other Ambulatory Visit: Payer: Self-pay

## 2019-02-15 NOTE — Telephone Encounter (Signed)
Patient scheduled for January 21.

## 2019-02-15 NOTE — Telephone Encounter (Signed)
Can you schedule visit with provider. Thanks

## 2019-02-16 ENCOUNTER — Ambulatory Visit: Payer: BC Managed Care – PPO | Admitting: Obstetrics & Gynecology

## 2019-04-04 NOTE — Telephone Encounter (Signed)
Hey Dr Selena Batten,  I have received some pictures from [place]. Could you take a look at these?  Thank You

## 2019-04-17 ENCOUNTER — Ambulatory Visit: Payer: BC Managed Care – PPO | Admitting: Allergy

## 2019-05-08 ENCOUNTER — Ambulatory Visit: Payer: BC Managed Care – PPO | Admitting: Allergy

## 2019-05-08 ENCOUNTER — Other Ambulatory Visit: Payer: Self-pay

## 2019-05-08 ENCOUNTER — Encounter: Payer: Self-pay | Admitting: Allergy

## 2019-05-08 VITALS — BP 120/70 | HR 82 | Temp 97.6°F | Resp 16 | Ht 62.0 in | Wt 120.0 lb

## 2019-05-08 DIAGNOSIS — J4520 Mild intermittent asthma, uncomplicated: Secondary | ICD-10-CM | POA: Diagnosis not present

## 2019-05-08 DIAGNOSIS — J310 Chronic rhinitis: Secondary | ICD-10-CM

## 2019-05-08 MED ORDER — LEVALBUTEROL TARTRATE 45 MCG/ACT IN AERO: 2.0000 | INHALATION_SPRAY | Freq: Four times a day (QID) | RESPIRATORY_TRACT | 1 refills | Status: AC | PRN

## 2019-05-08 MED ORDER — IPRATROPIUM BROMIDE 0.03 % NA SOLN: 1.0000 | Freq: Two times a day (BID) | NASAL | 5 refills | Status: AC | PRN

## 2019-05-08 MED ORDER — FLUTICASONE FUROATE-VILANTEROL 100-25 MCG/INH IN AEPB: 1.0000 | INHALATION_SPRAY | Freq: Every day | RESPIRATORY_TRACT | 1 refills | Status: AC

## 2019-05-08 NOTE — Progress Notes (Signed)
Follow Up Note  RE: Roberta Gillespie MRN: [id number] DOB: 11/18/1995 Date of Office Visit: 05/08/2019  Referring provider: Farris Has, MD Primary care provider: Farris Has, MD  Chief Complaint: Reactive airway disease (follow up) and Allergy Testing  History of Present Illness: I had the pleasure of seeing Roberta Gillespie on 05/08/2019. She is a 24 y.o. female, who is being followed for reactive airway disease and rhinitis. Her previous allergy office visit was on 11/04/2018 with Dr. Selena Gillespie. Today is a regular follow up visit and skin testing.  Reactive airway disease Sometimes gets some shortness of breath with exertion or if moving from cold to hot environment. Used albuterol less than 5 times since the last visit. Otherwise, denies any SOB, coughing, wheezing, chest tightness, nocturnal awakenings, ER/urgent care visits or prednisone use since the last visit.  Rhinitis: Patient has been off the medications for the past 6 months with no worsening symptoms.  Did not use nasal spray as it dries her out and causes epistaxis. She does have some PND.  Patient is really interested in getting a dog. She has not been around any dogs since the last visit.   Assessment and Plan: Roberta Gillespie is a 24 y.o. female with: Chronic rhinitis Past history - Perennial rhinoconjunctivitis symptoms for the past 15+ years with worsening from spring through fall.  Used Zyrtec, azelastine nasal spray and Flonase with some benefit.  Skin testing in 2019 was negative but had some delayed reactions on her way home - no records available for review.  Singulair caused nightmares and anxiety.  Status post sinus surgery 08/05/2018 and doing much better. Xyzal caused palpitations but tolerates zyrtec. 2020 bloodwork was negative to environmental allergies and IgE level was 9. Interim history -  Off medications since last OV with no worsening symptoms. Patient  interested in getting a pet dog. Still has some PND but nasal sprays tend to cause epistaxis.   Today's skin prick testing showed: Negative to indoor/outdoor allergens including dog. Deferred IDs as patient had bloodwork recently with negative environmental panel.  May use ipratropium 1-2 spray per nostril at night as needed for drainage - patient never tried this type of nasal spray previously.  May take Ryvent 1 hour before bedtime as needed for drainage. Sample given. Advised to contact our office if needs prescription for this medication.   Reactive airway disease Past history - Noticed some chest tightness, shortness of breath and coughing for the past year.  Main triggers are exposure to dogs and being outdoors.  Used albuterol as needed less than once a week with some benefit  She was also prescribed Breo in the past but not sure if it helped.  Intolerant of Singulair in the past. Albuterol causes palpitations.  Interim history - shortness of breath with heavy exertion and if going from cold to hot environment. Did not use Breo yet.   Daily controller medication(s):None  Prior to physical activity:May use levoalbuterol (xopenex) rescue inhaler 2 puffs 5 to 15 minutes prior to strenuous physical activities.  Rescue medications:May use levoalbuterol rescue inhaler 2 puffs every 4 to 6 hours as needed for shortness of breath, chest tightness, coughing, and wheezing. Monitor frequency of use.  Take the [facility] with you when you are going to be around dogs.  May take Breo 100 1 puff once a day as needed. Rinse out your mouth after each use and see if it helps.  Return in about 6 months (around 11/07/2019).  Meds ordered this encounter  Medications  . ipratropium (ATROVENT) 0.03 % nasal spray    Sig: Place 1-2 sprays into both nostrils 2 (two) times daily as needed (nasal drainage).    Dispense:  30 mL    Refill:  5  . levalbuterol (XOPENEX HFA) 45 MCG/ACT inhaler    Sig: Inhale 2  puffs into the lungs every 6 (six) hours as needed for wheezing or shortness of breath (coughing fits).    Dispense:  1 Inhaler    Refill:  1  . fluticasone furoate-vilanterol (BREO ELLIPTA) 100-25 MCG/INH AEPB    Sig: Inhale 1 puff into the lungs daily. Use during flares for 1-2 weeks at a time.    Dispense:  60 each    Refill:  1   Diagnostics: Skin Testing: Environmental allergy panel. Negative test to: environmental allergy panel.  Results discussed with patient/family. Airborne Adult Perc - 05/08/19 1454    Time Antigen Placed  1454    Allergen Manufacturer  Waynette Buttery    Location  Back    Number of Test  59    1. Control-Buffer 50% Glycerol  Negative    2. Control-Histamine 1 mg/ml  2+    3. Albumin saline  Negative    4. Bahia  Negative    5. [place]  Negative    6. Johnson  Negative    7. Kentucky Blue  Negative    8. Meadow Fescue  Negative    9. Perennial Rye  Negative    10. Sweet Vernal  Negative    11. Timothy  Negative    12. Cocklebur  Negative    13. Burweed Marshelder  Negative    14. Ragweed, short  Negative    15. Ragweed, Giant  Negative    16. Plantain,  English  Negative    17. Lamb's Quarters  Negative    18. Sheep Sorrell  Negative    19. Rough Pigweed  Negative    20. Marsh Elder, Rough  Negative    21. Mugwort, Common  Negative    22. Ash mix  Negative    23. Birch mix  Negative    24. Beech American  Negative    25. Box, Elder  Negative    26. Cedar, red  Negative    27. Cottonwood, Guinea-Bissau  Negative    28. Elm mix  Negative    29. Hickory mix  Negative    30. Maple mix  Negative    31. Oak, Guinea-Bissau mix  Negative    32. Pecan Pollen  Negative    33. Pine mix  Negative    34. Sycamore Eastern  Negative    35. Walnut, Black Pollen  Negative    36. Alternaria alternata  Negative    37. Cladosporium Herbarum  Negative    38. Aspergillus mix  Negative    39. Penicillium mix  Negative    40. Bipolaris sorokiniana (Helminthosporium)  Negative     41. Drechslera spicifera (Curvularia)  Negative    42. Mucor plumbeus  Negative    43. Fusarium moniliforme  Negative    44. Aureobasidium pullulans (pullulara)  Negative    45. Rhizopus oryzae  Negative    46. Botrytis cinera  Negative    47. Epicoccum nigrum  Negative    48. Phoma betae  Negative    49. Candida Albicans  Negative    50. Trichophyton mentagrophytes  Negative  51. Mite, D Farinae  5,000 AU/ml  Negative    52. Mite, D Pteronyssinus  5,000 AU/ml  Negative    53. Cat Hair 10,000 BAU/ml  Negative    54.  Dog Epithelia  Negative    55. Mixed Feathers  Negative    56. Horse Epithelia  Negative    57. Cockroach, German  Negative    58. Mouse  Negative    59. Tobacco Leaf  Negative       Medication List:  Current Outpatient Medications  Medication Sig Dispense Refill  . Acetaminophen (TYLENOL PO) Take by mouth as needed.    . Adapalene 0.3 % gel     . albuterol (VENTOLIN HFA) 108 (90 Base) MCG/ACT inhaler Inhale 2 puffs into the lungs every 4 (four) hours as needed for wheezing or shortness of breath (coughing). 18 g 1  . folic acid (FOLVITE) 1 MG tablet Take 1 mg by mouth daily.    . hydroxychloroquine (PLAQUENIL) 200 MG tablet Take 300 mg by mouth daily. Started today to take 1 1/2 tablet once daily for a total of 300 mg daily.    . Hypertonic Nasal Wash (SINUS RINSE) PACK use as directed on packaging; Use distilled or sterile water    . IBUPROFEN PO Take by mouth as needed.    Marland Kitchen levonorgestrel (MIRENA) 20 MCG/24HR IUD 1 each by Intrauterine route once.    . methotrexate (RHEUMATREX) 2.5 MG tablet Take 10 mg by mouth once a week.    . metroNIDAZOLE (METROGEL) 1 % gel     . Multiple Vitamins-Minerals (MULTIVITAMIN WITH MINERALS) tablet Take 1 tablet by mouth daily.    . nitrofurantoin, macrocrystal-monohydrate, (MACROBID) 100 MG capsule TAKE 1 CAPSULE BY MOUTH ONCE FOR 1 DOSE. PROPHYLAXIS FOR CYSTITIS. TAKE 1 TAB BEFORE INTERCOURS.    . Probiotic Product (PROBIOTIC  PO) Take 1 tablet by mouth daily.    . Sulfacetamide Sodium-Sulfur 8-4 % SUSP     . fluticasone furoate-vilanterol (BREO ELLIPTA) 100-25 MCG/INH AEPB Inhale 1 puff into the lungs daily. Use during flares for 1-2 weeks at a time. 60 each 1  . ipratropium (ATROVENT) 0.03 % nasal spray Place 1-2 sprays into both nostrils 2 (two) times daily as needed (nasal drainage). 30 mL 5  . levalbuterol (XOPENEX HFA) 45 MCG/ACT inhaler Inhale 2 puffs into the lungs every 6 (six) hours as needed for wheezing or shortness of breath (coughing fits). 1 Inhaler 1   No current facility-administered medications for this visit.   Allergies: Allergies  Allergen Reactions  . Benzonatate Anaphylaxis  . Amoxil [Amoxicillin] Itching  . Latex Itching and Rash  . Lorabid [Loracarbef] Hives  . Montelukast Anxiety    Had nightmares and anxiety  . Xyzal [Levocetirizine] Palpitations   I reviewed her past medical history, social history, family history, and environmental history and no significant changes have been reported from her previous visit.  Review of Systems  Constitutional: Negative for appetite change, chills, fever and unexpected weight change.  HENT: Positive for postnasal drip. Negative for congestion and rhinorrhea.   Eyes: Negative for itching.  Respiratory: Negative for cough, chest tightness, shortness of breath and wheezing.   Cardiovascular: Negative for chest pain.  Gastrointestinal: Negative for abdominal pain.  Genitourinary: Negative for difficulty urinating.  Skin: Negative for rash.  Allergic/Immunologic: Negative for environmental allergies and food allergies.  Neurological: Negative for headaches.   Objective: BP 120/70 (BP Location: Left Arm, Patient Position: Sitting, Cuff Size: Normal)   Pulse  82   Temp 97.6 F (36.4 C) (Temporal)   Resp 16   Ht 5\' 2"  (1.575 m)   Wt 120 lb (54.4 kg)   SpO2 100%   BMI 21.95 kg/m  Body mass index is 21.95 kg/m. Physical Exam  Constitutional:  She is oriented to person, place, and time. She appears well-developed and well-nourished.  HENT:  Head: Normocephalic and atraumatic.  Right Ear: External ear normal.  Left Ear: External ear normal.  Mouth/Throat: Oropharynx is clear and moist.  Eyes: Conjunctivae and EOM are normal.  Cardiovascular: Normal rate, regular rhythm and normal heart sounds. Exam reveals no gallop and no friction rub.  No murmur heard. Pulmonary/Chest: Effort normal and breath sounds normal. She has no wheezes. She has no rales.  Abdominal: Soft.  Musculoskeletal:     Cervical back: Neck supple.  Neurological: She is alert and oriented to person, place, and time.  Skin: Skin is warm. No rash noted.  Psychiatric: She has a normal mood and affect. Her behavior is normal.  Nursing note and vitals reviewed.  Previous notes and tests were reviewed. The plan was reviewed with the patient/family, and all questions/concerned were addressed.  It was my pleasure to see Mosella today and participate in her care. Please feel free to contact me with any questions or concerns.  Sincerely,  Rexene Alberts, DO Allergy & Immunology  [facility] office: [phone] [facility] office: [place] office: [phone]

## 2019-05-08 NOTE — Assessment & Plan Note (Signed)
Past history - Perennial rhinoconjunctivitis symptoms for the past 15+ years with worsening from spring through fall.  Used Zyrtec, azelastine nasal spray and Flonase with some benefit.  Skin testing in 2019 was negative but had some delayed reactions on her way home - no records available for review.  Singulair caused nightmares and anxiety.  Status post sinus surgery 08/05/2018 and doing much better. Xyzal caused palpitations but tolerates zyrtec. 2020 bloodwork was negative to environmental allergies and IgE level was 9. Interim history -  Off medications since last OV with no worsening symptoms. Patient interested in getting a pet dog. Still has some PND but nasal sprays tend to cause epistaxis.   Today's skin prick testing showed: Negative to indoor/outdoor allergens including dog. Deferred IDs as patient had bloodwork recently with negative environmental panel.  May use ipratropium 1-2 spray per nostril at night as needed for drainage - patient never tried this type of nasal spray previously.  May take Ryvent 1 hour before bedtime as needed for drainage. Sample given. Advised to contact our office if needs prescription for this medication.

## 2019-05-08 NOTE — Patient Instructions (Addendum)
Today's skin testing showed: Negative to indoor/outdoor allergens.  Reactive airway disease  Daily controller medication(s):None  Prior to physical activity:May use levoalbuterol (xopenex) rescue inhaler 2 puffs 5 to 15 minutes prior to strenuous physical activities.  Rescue medications:May use levoalbuterol rescue inhaler 2 puffs every 4 to 6 hours as needed for shortness of breath, chest tightness, coughing, and wheezing. Monitor frequency of use.  Take the [facility] with you when you are going to be around dogs.  May take Breo 100 1 puff once a day as needed. Rinse out your mouth after each use.  Nonallergic rhinitis  May use ipratropium 1-2 spray per nostril at night as needed for drainage.  May take Ryvent 1 hour before bedtime as needed for drainage.   Follow up in 6 months or sooner if needed.

## 2019-05-08 NOTE — Assessment & Plan Note (Signed)
Past history - Noticed some chest tightness, shortness of breath and coughing for the past year.  Main triggers are exposure to dogs and being outdoors.  Used albuterol as needed less than once a week with some benefit  She was also prescribed Breo in the past but not sure if it helped.  Intolerant of Singulair in the past. Albuterol causes palpitations.  Interim history - shortness of breath with heavy exertion and if going from cold to hot environment. Did not use Breo yet.   Daily controller medication(s):None  Prior to physical activity:May use levoalbuterol (xopenex) rescue inhaler 2 puffs 5 to 15 minutes prior to strenuous physical activities.  Rescue medications:May use levoalbuterol rescue inhaler 2 puffs every 4 to 6 hours as needed for shortness of breath, chest tightness, coughing, and wheezing. Monitor frequency of use.  Take the [facility] with you when you are going to be around dogs.  May take Breo 100 1 puff once a day as needed. Rinse out your mouth after each use and see if it helps.

## 2019-05-10 ENCOUNTER — Ambulatory Visit: Payer: BC Managed Care – PPO | Admitting: Allergy

## 2019-05-12 ENCOUNTER — Other Ambulatory Visit: Payer: Self-pay | Admitting: *Deleted

## 2019-05-12 ENCOUNTER — Telehealth: Payer: Self-pay | Admitting: *Deleted

## 2019-05-12 MED ORDER — BUDESONIDE-FORMOTEROL FUMARATE 80-4.5 MCG/ACT IN AERO: 2.0000 | INHALATION_SPRAY | Freq: Two times a day (BID) | RESPIRATORY_TRACT | 1 refills | Status: AC

## 2019-05-12 MED ORDER — BUDESONIDE-FORMOTEROL FUMARATE 80-4.5 MCG/ACT IN AERO: 2.0000 | INHALATION_SPRAY | Freq: Two times a day (BID) | RESPIRATORY_TRACT | 1 refills | Status: DC | PRN

## 2019-05-12 NOTE — Telephone Encounter (Signed)
This is only for as needed use for patient when she is around dogs.   Can you check if we have any samples of it in the [place] office?   If we do, can you tell patient to pick up 1 sample?  If not, then send in Symbicort 80 2 puffs twice a day as needed.   Thank you.

## 2019-05-12 NOTE — Telephone Encounter (Signed)
Unfortunately there were no Breo samples available in office. Symbicort has been sent in but in order for it to be covered by insurance it had to be sent in a three months supply to [facility]. Called and informed patient of change in inhaler and she was fine with it being sent to [facility]. New prescription has been sent in.

## 2019-05-12 NOTE — Addendum Note (Signed)
Addended by: Dollene Cleveland R on: 05/12/2019 10:34 AM   Modules accepted: Orders

## 2019-05-12 NOTE — Telephone Encounter (Signed)
Attempted a PA for Breo through CoverMyMeds but was unable to get it to go through, called [employer] and they stated that the Virgel Bouquet is not covered and the preferred alternatives are Wixela, Dulera, Symbicort, and Fluticasone Salmeterol. Patient has only tried and failed Flovent Diskus. Please advise change in medication.

## 2019-07-12 ENCOUNTER — Telehealth: Payer: Self-pay | Admitting: Allergy

## 2019-07-12 NOTE — Telephone Encounter (Signed)
Patient called and was waiting on prior approver on her Virgel Bouquet and has not heard anything [facility] [address] [phone].

## 2019-07-14 IMAGING — CT CT MAXILLOFACIAL W/O CM
1 series · 15 of 30 positions shown, 19 images · non-contrast
Comparison: None.

CLINICAL DATA: Chronic sinusitis.

EXAM:
CT MAXILLOFACIAL WITHOUT CONTRAST
TECHNIQUE: Multidetector CT images of the paranasal sinuses were obtained using
the standard protocol without intravenous contrast.

[Series 4: soft tissue · axial · 0.41mm/px · z∈[-194,-86]mm · 15 of 117 slices shown, 19 images]
[im 5/117  brain]
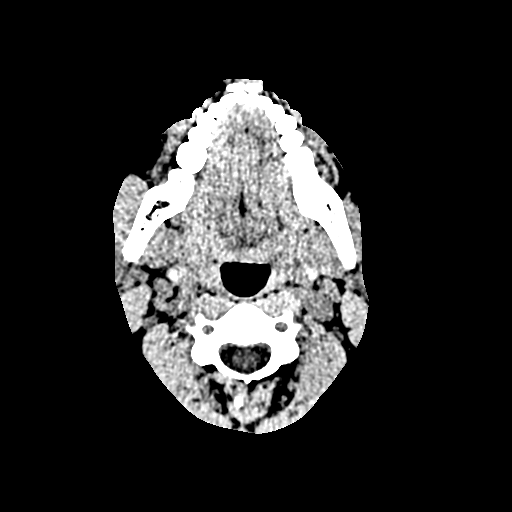
[im 5/117  bone]
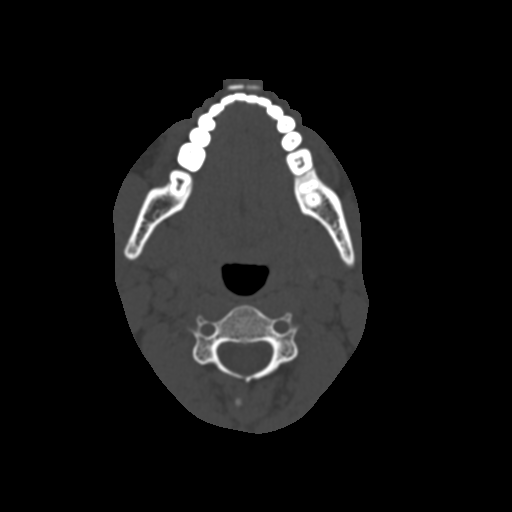
[im 13/117  bone]
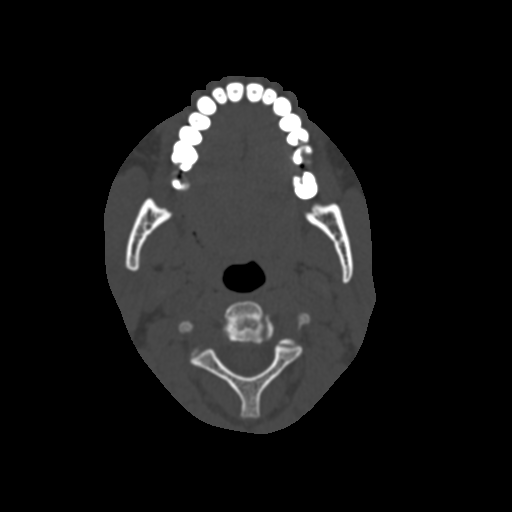
[im 21/117  bone]
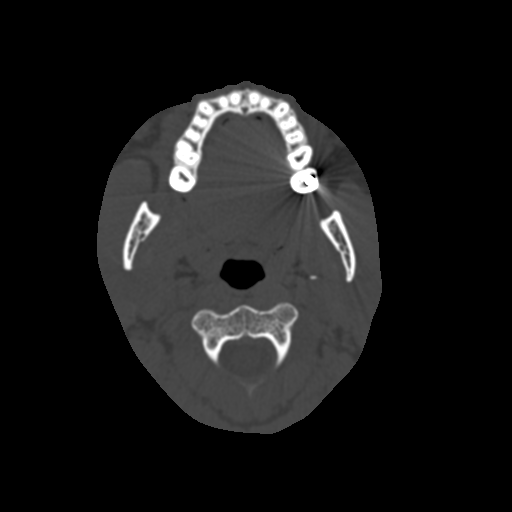
[im 29/117  bone]
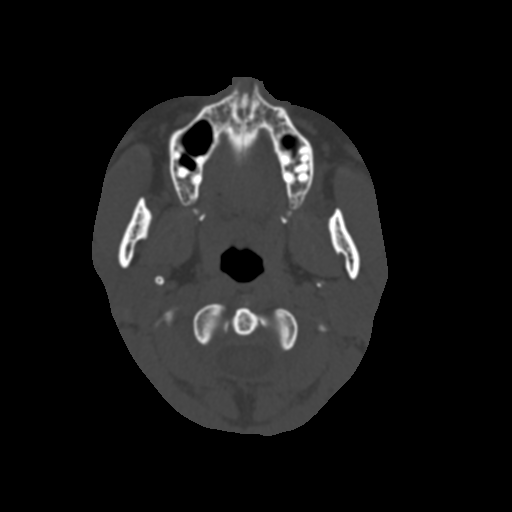
[im 37/117  brain]
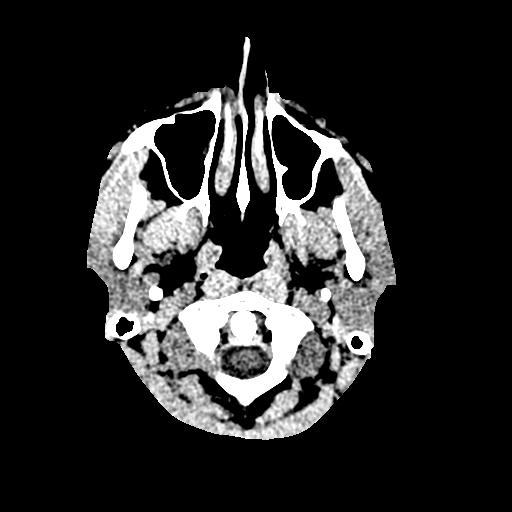
[im 37/117  bone]
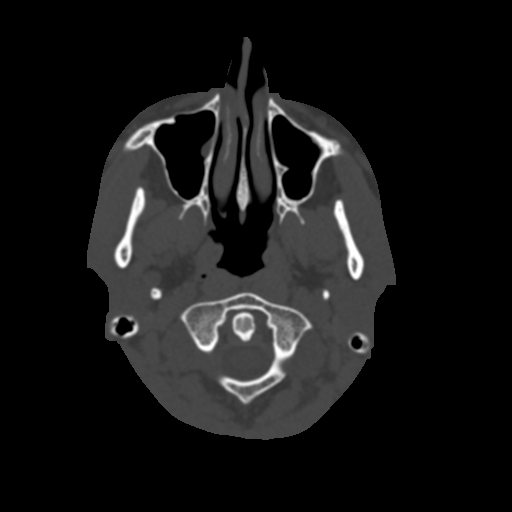
[im 45/117  bone]
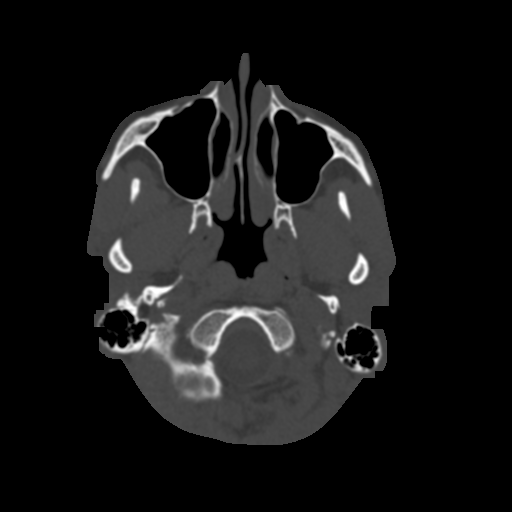
[im 53/117  bone]
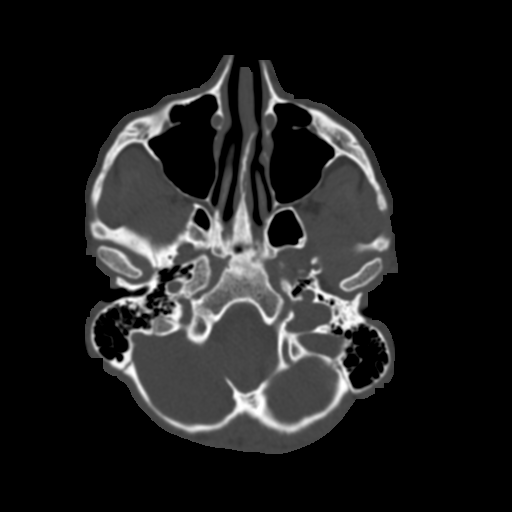
[im 61/117  bone]
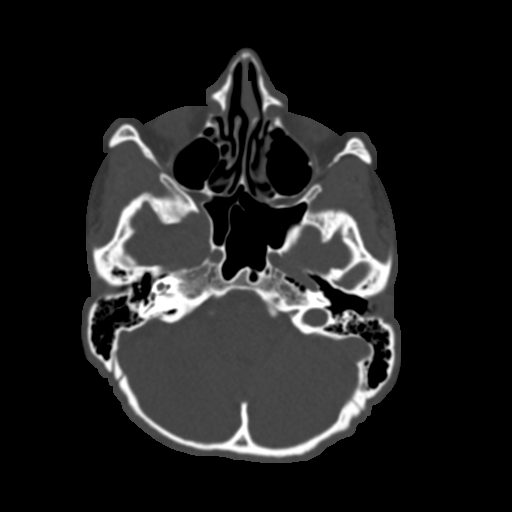
[im 65/117  brain]
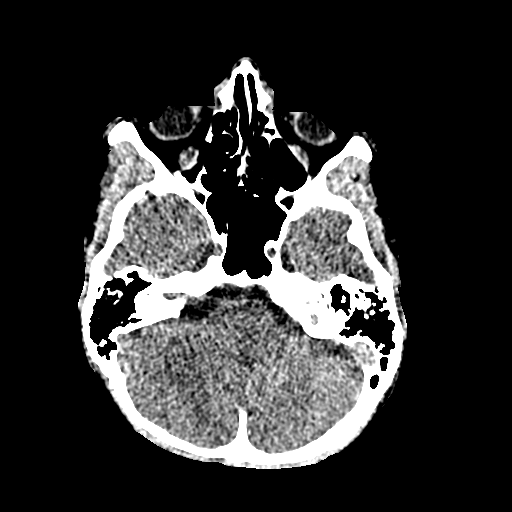
[im 65/117  bone]
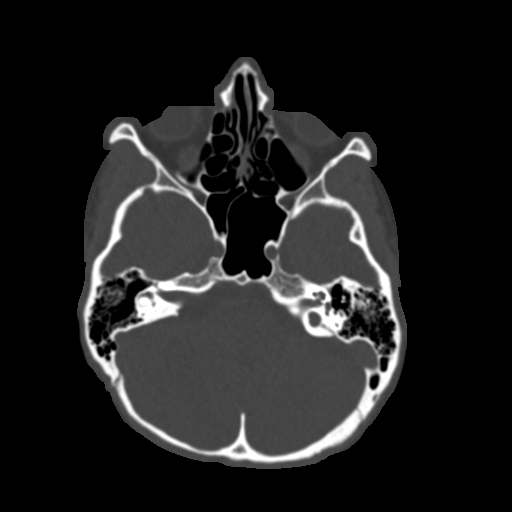
[im 73/117  bone]
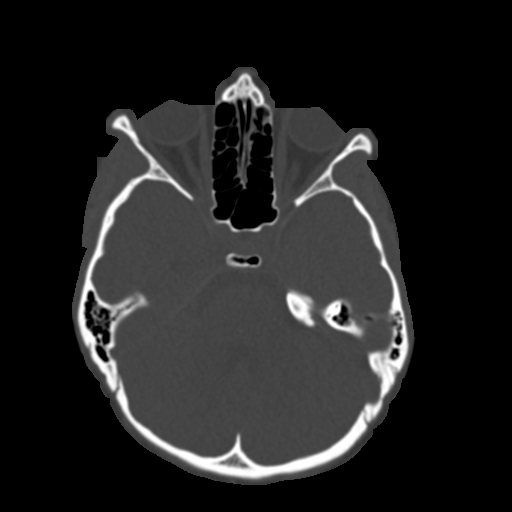
[im 81/117  bone]
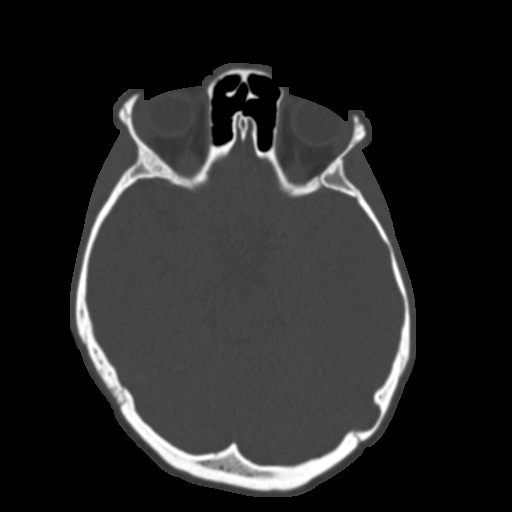
[im 89/117  bone]
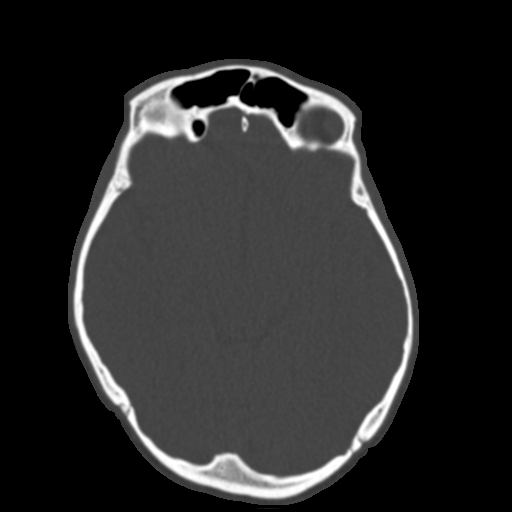
[im 97/117  brain]
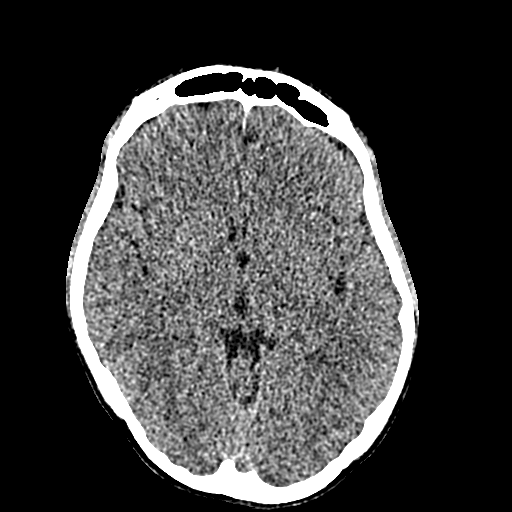
[im 97/117  bone]
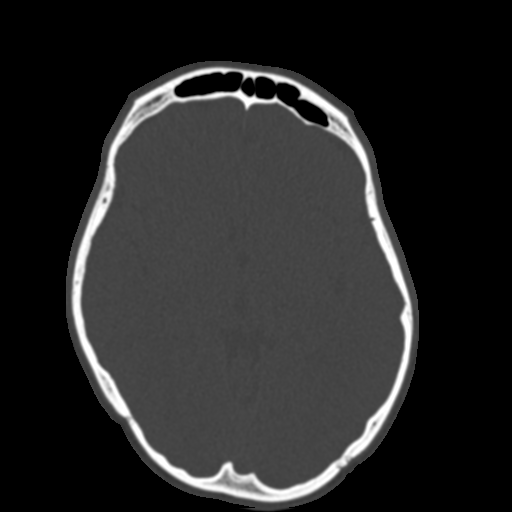
[im 105/117  bone]
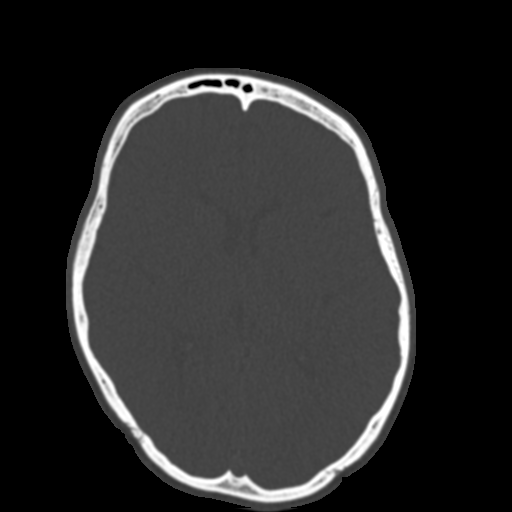
[im 113/117  bone]
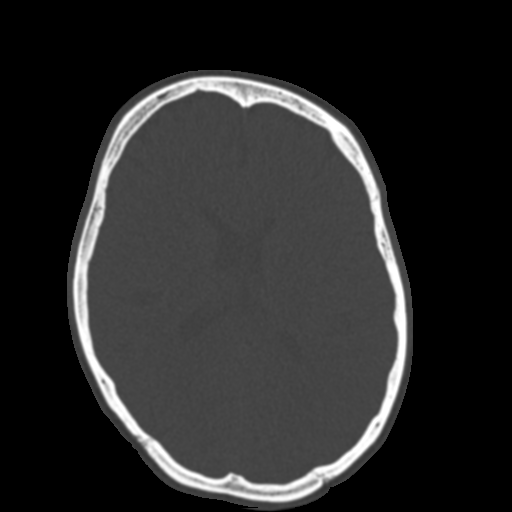

[15 of 30 positions shown; findings below may reference images not displayed]

FINDINGS: Paranasal sinuses:

Frontal: At most trace mucosal thickening in the inferior frontal
sinuses/superior aspects of the frontal recesses bilaterally.

Ethmoid: Minimal right anterior ethmoid air cell mucosal thickening,
otherwise clear bilaterally.

Maxillary: Focal mucosal thickening/1 cm mucous retention cyst
medially in the right maxillary sinus. Minimal mucosal thickening in
the left maxillary sinus just posterior to the ostium. No fluid.

Sphenoid: Normally aerated. Patent sphenoethmoidal recesses.

Right ostiomeatal unit: Patent.

Left ostiomeatal unit: Patent.

Nasal passages: Minimal mucosal thickening superiorly in the nasal
cavity bilaterally. 6 mm leftward nasal septal deviation
anterosuperiorly. Mild rightward septal spurring more posteriorly.

Anatomy: Pneumatization superior to the anterior ethmoid notches
bilaterally. Keros II. Sellar sphenoid pneumatization pattern. No
dehiscence of carotid or optic canals. No onodi cell.

Other: Clear mastoid air cells and tympanic cavities. Unremarkable
appearance of the orbits and included portion of the brain.
IMPRESSION: 1. Minimal scattered mucosal thickening in the paranasal sinuses. No
fluid.
2. Leftward nasal septal deviation anteriorly.

## 2019-07-17 NOTE — Telephone Encounter (Signed)
Thank you :)

## 2019-07-17 NOTE — Telephone Encounter (Signed)
Pa submitted thru cover my meds 

## 2019-07-25 NOTE — Telephone Encounter (Addendum)
Pa was denied for breo Please advise to what else we could try to change too. Nothing was shown on formulary less then a tier 3

## 2019-07-26 MED ORDER — FLUTICASONE-SALMETEROL 113-14 MCG/ACT IN AEPB: 1.0000 | INHALATION_SPRAY | Freq: Two times a day (BID) | RESPIRATORY_TRACT | 3 refills | Status: AC

## 2019-07-26 NOTE — Telephone Encounter (Signed)
Sent in Airduo - this is what we get samples of as well.  1 puff twice a day to use when around dogs.   Try the sample first make sure it works before picking up script.

## 2019-07-26 NOTE — Addendum Note (Signed)
Addended by: Ellamae Sia on: 07/26/2019 08:31 PM   Modules accepted: Orders

## 2019-07-26 NOTE — Telephone Encounter (Signed)
I believe she is only using Breo when around pet dander.  Can you confirm it is not a daily inhaler?  And what happened with Symbicort?  Airduo respiclick is an option as there is a coupon for it in the office and we can give her a sample.

## 2019-07-26 NOTE — Telephone Encounter (Addendum)
Patient states Symbicort caused heart racing and very shaky feeling.  Patient only using it when around pet dander.  Patient is going to be around her parents and in-laws a lot over then next few months.  They have cats and dogs. She will not be back in [place] until 08/07/19. At that time, she will go by the [facility] clinic to pick up a sample of Airduo respiclick WITH A COUPON.  What strength does Dr. Selena Batten want for the Airduo respiclick? 55,115, or 232?  Patient states she has enough Breo on hand to last her until she can get the sample. Will need to get strength and directions for Airduo Respiclick from Dr. Selena Batten, then [facility] [address] [facility] AirDuo respiclick and have pharmacy put on file per patient request. Please let [facility] clinic know to pull a sample of the AirDuo respiclick with a coupon and put up front desk for patient to pick up on 08/07/19.

## 2019-07-27 NOTE — Telephone Encounter (Signed)
Left voicemail for patient to call the office in regards to this matter. Unfortunately we do not have any samples in our [place] or [place] locations at this time. We will check our [place] location next Wednesday 08/02/2019 when we are back at that office to see if there is any there. Can someone please check [place] for an Airduo 113 mcg?

## 2019-07-27 NOTE — Telephone Encounter (Signed)
Spoke with patient and informed her of the medication change and that we are looking to get her a sample before picking it up so we can see how she will do on it. Patient verbalized understanding and asked if her husband would be able to pick it up. I informed her that would be fine and that we will give her a call once we get a sample. Patient verbalized understanding.

## 2019-08-03 NOTE — Telephone Encounter (Signed)
Mailbox was full and could not leave a message. 

## 2019-08-03 NOTE — Telephone Encounter (Signed)
Called to inform patient that we do have a sample of the Airduo and it will be up front in [place] ready for pick up on 08/04/2019.

## 2019-08-04 NOTE — Telephone Encounter (Signed)
Spoke with patient and she has been informed that the Airduo is up front ready for pick up on Monday. Patient verbalized understanding.

## 2019-09-22 IMAGING — CR CHEST - 2 VIEW
2 series · 2 of 2 positions shown · non-contrast
Comparison: Prior radiograph from 08/26/2017.

CLINICAL DATA: Initial evaluation for increased shortness of breath
over 4 weeks.

EXAM:
CHEST - 2 VIEW

[w chest pa]
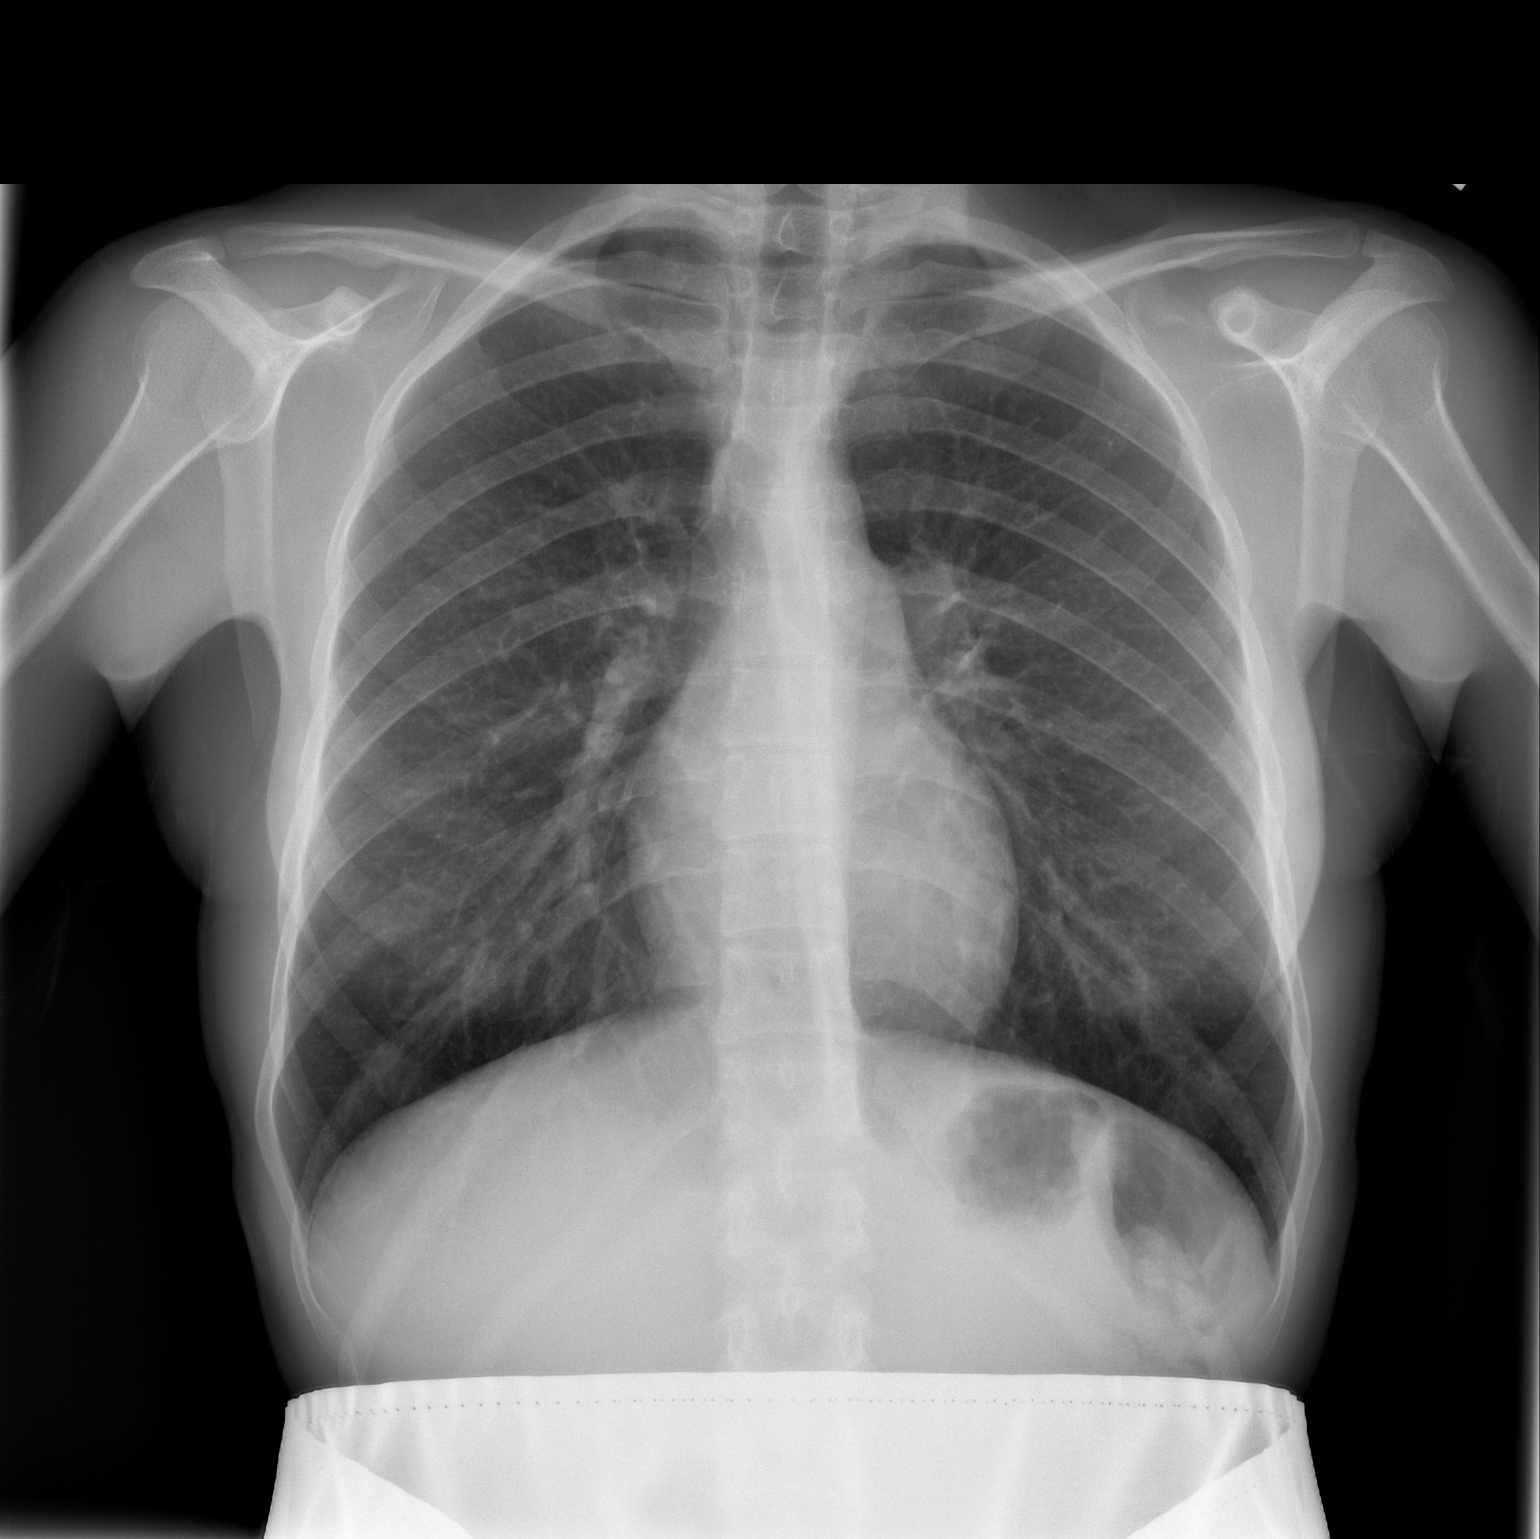

[w chest lat]
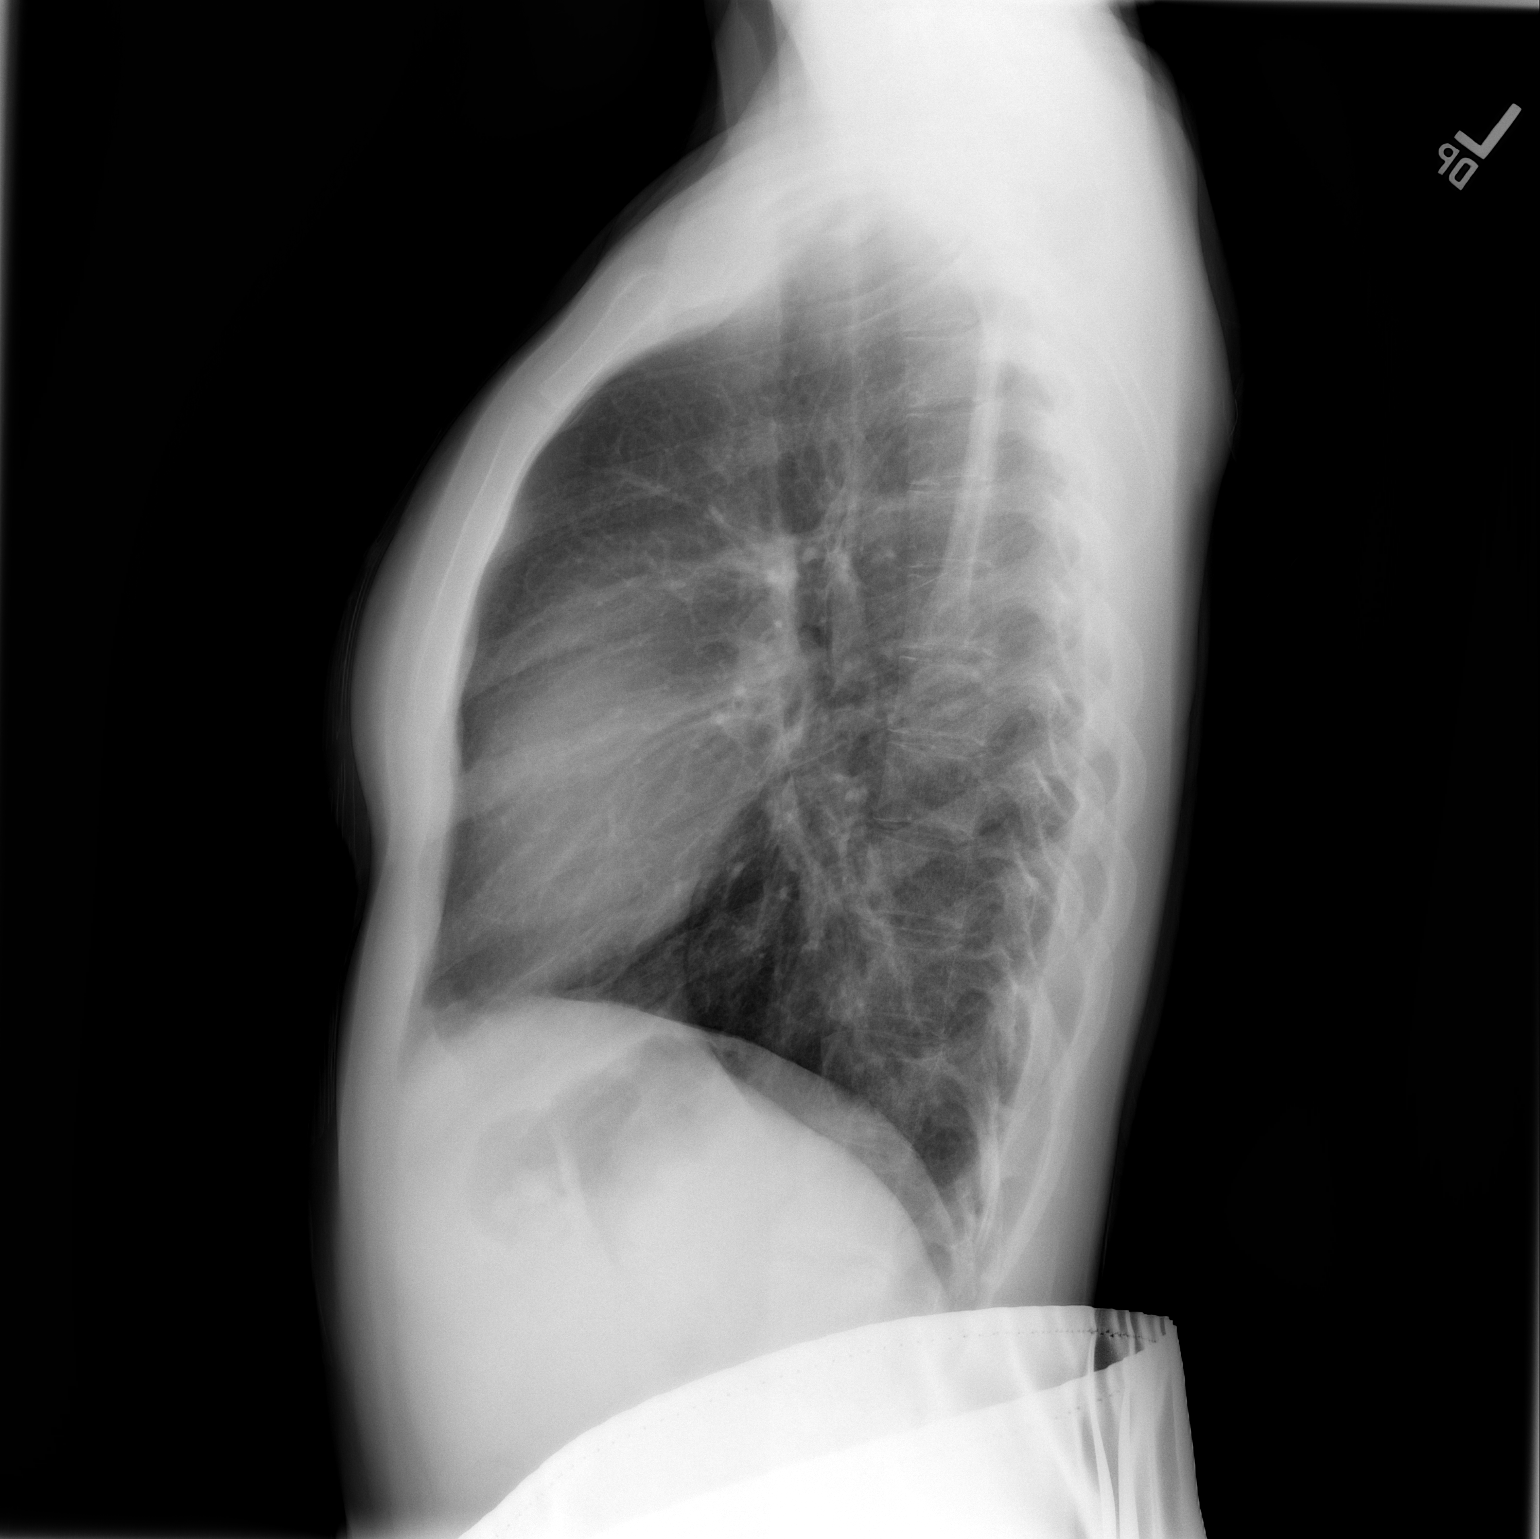

[2 of 2 positions shown; findings below may reference images not displayed]

FINDINGS: The cardiac and mediastinal silhouettes are stable in size and
contour, and remain within normal limits.

The lungs are normally inflated. No airspace consolidation, pleural
effusion, or pulmonary edema is identified. There is no
pneumothorax.

No acute osseous abnormality identified.
IMPRESSION: No radiographic evidence for active cardiopulmonary disease.
